# Patient Record
Sex: Female | Born: 1990 | Race: Asian | Hispanic: No | Marital: Married | Smoking: Never smoker
Health system: Southern US, Community
[De-identification: ages and names within clinical notes are randomized; demographics above are authoritative.]

---
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
## 2017-09-09 ENCOUNTER — Emergency Department (HOSPITAL_BASED_OUTPATIENT_CLINIC_OR_DEPARTMENT_OTHER): Payer: BLUE CROSS/BLUE SHIELD

## 2017-09-09 ENCOUNTER — Emergency Department (HOSPITAL_BASED_OUTPATIENT_CLINIC_OR_DEPARTMENT_OTHER)
Admission: EM | Admit: 2017-09-09 | Discharge: 2017-09-09 | Disposition: A | Discharge status: Home / Self Care | Payer: BLUE CROSS/BLUE SHIELD | Attending: Emergency Medicine | Admitting: Emergency Medicine

## 2017-09-09 ENCOUNTER — Encounter (HOSPITAL_BASED_OUTPATIENT_CLINIC_OR_DEPARTMENT_OTHER): Payer: Self-pay

## 2017-09-09 DIAGNOSIS — R1030 Lower abdominal pain, unspecified: Secondary | ICD-10-CM | POA: Diagnosis present

## 2017-09-09 DIAGNOSIS — N760 Acute vaginitis: Secondary | ICD-10-CM | POA: Diagnosis not present

## 2017-09-09 DIAGNOSIS — B9689 Other specified bacterial agents as the cause of diseases classified elsewhere: Secondary | ICD-10-CM

## 2017-09-09 DIAGNOSIS — R110 Nausea: Secondary | ICD-10-CM | POA: Insufficient documentation

## 2017-09-09 DIAGNOSIS — N83201 Unspecified ovarian cyst, right side: Secondary | ICD-10-CM | POA: Insufficient documentation

## 2017-09-09 LAB — CBC WITH DIFFERENTIAL/PLATELET
BASOS ABS: 0 10*3/uL (ref 0.0–0.1)
Basophils Relative: 0 %
EOS PCT: 0 %
Eosinophils Absolute: 0 10*3/uL (ref 0.0–0.7)
HEMATOCRIT: 38.7 % (ref 36.0–46.0)
HEMOGLOBIN: 13.9 g/dL (ref 12.0–15.0)
LYMPHS ABS: 1.5 10*3/uL (ref 0.7–4.0)
LYMPHS PCT: 17 %
MCH: 31 pg (ref 26.0–34.0)
MCHC: 35.9 g/dL (ref 30.0–36.0)
MCV: 86.2 fL (ref 78.0–100.0)
Monocytes Absolute: 0.4 10*3/uL (ref 0.1–1.0)
Monocytes Relative: 4 %
NEUTROS PCT: 79 %
Neutro Abs: 7.1 10*3/uL (ref 1.7–7.7)
Platelets: 277 10*3/uL (ref 150–400)
RBC: 4.49 MIL/uL (ref 3.87–5.11)
RDW: 12 % (ref 11.5–15.5)
WBC: 9 10*3/uL (ref 4.0–10.5)

## 2017-09-09 LAB — URINALYSIS, ROUTINE W REFLEX MICROSCOPIC
BILIRUBIN URINE: NEGATIVE
GLUCOSE, UA: NEGATIVE mg/dL
Ketones, ur: 15 mg/dL — AB
Leukocytes, UA: NEGATIVE
Nitrite: NEGATIVE
PROTEIN: NEGATIVE mg/dL
Specific Gravity, Urine: <1.005 — ABNORMAL LOW (ref 1.005–1.030)
pH: 5.5 (ref 5.0–8.0)

## 2017-09-09 LAB — COMPREHENSIVE METABOLIC PANEL
ALT: 11 U/L — AB (ref 14–54)
AST: 21 U/L (ref 15–41)
Albumin: 4.1 g/dL (ref 3.5–5.0)
Alkaline Phosphatase: 50 U/L (ref 38–126)
Anion gap: 8 (ref 5–15)
BILIRUBIN TOTAL: 1.1 mg/dL (ref 0.3–1.2)
BUN: 13 mg/dL (ref 6–20)
CO2: 22 mmol/L (ref 22–32)
CREATININE: 0.47 mg/dL (ref 0.44–1.00)
Calcium: 8.7 mg/dL — ABNORMAL LOW (ref 8.9–10.3)
Chloride: 107 mmol/L (ref 101–111)
Glucose, Bld: 95 mg/dL (ref 65–99)
Potassium: 3.6 mmol/L (ref 3.5–5.1)
Sodium: 137 mmol/L (ref 135–145)
Total Protein: 7.6 g/dL (ref 6.5–8.1)

## 2017-09-09 LAB — PREGNANCY, URINE: PREG TEST UR: NEGATIVE

## 2017-09-09 LAB — URINALYSIS, MICROSCOPIC (REFLEX)

## 2017-09-09 LAB — WET PREP, GENITAL
Sperm: NONE SEEN
Trich, Wet Prep: NONE SEEN
Yeast Wet Prep HPF POC: NONE SEEN

## 2017-09-09 LAB — LIPASE, BLOOD: LIPASE: 30 U/L (ref 11–51)

## 2017-09-09 MED ORDER — IOPAMIDOL (ISOVUE-300) INJECTION 61%
30.0000 mL | Freq: Once | INTRAVENOUS | Status: AC | PRN
  Administered 2017-09-09: 15 mL via ORAL

## 2017-09-09 MED ORDER — NAPROXEN 500 MG PO TABS: 500.0000 mg | ORAL_TABLET | Freq: Two times a day (BID) | ORAL | 0 refills | Status: DC

## 2017-09-09 MED ORDER — IOPAMIDOL (ISOVUE-300) INJECTION 61%
100.0000 mL | Freq: Once | INTRAVENOUS | Status: AC | PRN
  Administered 2017-09-09: 100 mL via INTRAVENOUS

## 2017-09-09 MED ORDER — MORPHINE SULFATE (PF) 4 MG/ML IV SOLN
4.0000 mg | Freq: Once | INTRAVENOUS | Status: AC
  Administered 2017-09-09: 4 mg via INTRAVENOUS
  Filled 2017-09-09: qty 1

## 2017-09-09 MED ORDER — ONDANSETRON HCL 4 MG/2ML IJ SOLN
4.0000 mg | Freq: Once | INTRAMUSCULAR | Status: AC
  Administered 2017-09-09: 4 mg via INTRAVENOUS
  Filled 2017-09-09: qty 2

## 2017-09-09 MED ORDER — ONDANSETRON HCL 4 MG PO TABS: 4.0000 mg | ORAL_TABLET | Freq: Three times a day (TID) | ORAL | 0 refills | Status: DC | PRN

## 2017-09-09 MED ORDER — METRONIDAZOLE 500 MG PO TABS: 500.0000 mg | ORAL_TABLET | Freq: Two times a day (BID) | ORAL | 0 refills | Status: DC

## 2017-09-09 MED ORDER — SODIUM CHLORIDE 0.9 % IV BOLUS
1000.0000 mL | Freq: Once | INTRAVENOUS | Status: AC
  Administered 2017-09-09: 1000 mL via INTRAVENOUS

## 2017-09-09 MED FILL — metroNIDAZOLE 500 MG TABS: 500 | 7 days supply | Qty: 14 | Fill #0

## 2017-09-09 MED FILL — NAPROXEN 500 MG TABLET: 500 | 15 days supply | Qty: 30 | Fill #0

## 2017-09-09 MED FILL — ONDANSETRON HCL 4 MG TABLET: 4 | 2 days supply | Qty: 4 | Fill #0

## 2017-09-09 NOTE — ED Notes (Signed)
NAD at this time. Pt is stable and going home.  

## 2017-09-09 NOTE — ED Notes (Signed)
Pt states sent here for [place] checking for ovarian cyst or CT for appendicitis

## 2017-09-09 NOTE — ED Provider Notes (Signed)
[facility] EMERGENCY DEPARTMENT Provider Note   CSN: [id number] Arrival date & time: 09/09/17  4098     History   Chief Complaint Chief Complaint  Patient presents with  . Abdominal Pain    HPI Rebecca Hughes is a 27 y.o. female no significant past medical history presents emergency department today for lower abdominal pain that began today.  Patient states that around 7:30 AM this morning she started having pain in her suprapubic and right lower quadrant that she describes as a sharp, intense pain.  She notes that the pain would occasionally radiate to her "pelvic bone".  She notes that leaning forward on the counter made her symptoms better.  She was seen at urgent care and given a dose of Toradol has improved the pain to a 2/10.  She has developed some nausea but denies any emesis.  She notes that nothing makes the symptoms worse.  She denies previous pain similar to this.  No fever, chills, chest pain, shortness of breath, emesis, diarrhea, urinary frequency, urinary urgency, hematuria, dysuria, vaginal discharge or vaginal bleeding.  Patient is sexually active with one female partner does not always use protection.  She denies history of STDs or concerns for STDs at this time.  Patient's last bowel movement was yesterday and normal.  No melena or hematochezia.  Patient denies any previous abdominal surgeries.  Patient last oral intake was at 7 AM this morning and included a glass of water.  HPI  History reviewed. No pertinent past medical history.  There are no active problems to display for this patient.   History reviewed. No pertinent surgical history.   OB History   None      Home Medications    Prior to Admission medications   Not on File    Family History No family history on file.  Social History Social History   Tobacco Use  . Smoking status: Never Smoker  . Smokeless tobacco: Never Used  Substance Use Topics  . Alcohol use: Never    Frequency:  Never  . Drug use: Never     Allergies   Patient has no known allergies.   Review of Systems Review of Systems  All other systems reviewed and are negative.    Physical Exam Updated Vital Signs BP 122/75 (BP Location: Right Arm)   Pulse 67   Temp 98.4 F (36.9 C) (Oral)   Resp 18   SpO2 100%   Physical Exam  Constitutional: She appears well-developed and well-nourished.  HENT:  Head: Normocephalic and atraumatic.  Right Ear: External ear normal.  Left Ear: External ear normal.  Nose: Nose normal.  Mouth/Throat: Uvula is midline, oropharynx is clear and moist and mucous membranes are normal. No tonsillar exudate.  Eyes: Pupils are equal, round, and reactive to light. Right eye exhibits no discharge. Left eye exhibits no discharge. No scleral icterus.  Neck: Trachea normal. Neck supple. No spinous process tenderness present. No neck rigidity. Normal range of motion present.  Cardiovascular: Normal rate, regular rhythm and intact distal pulses.  No murmur heard. Pulses:      Radial pulses are 2+ on the right side, and 2+ on the left side.       Dorsalis pedis pulses are 2+ on the right side, and 2+ on the left side.       Posterior tibial pulses are 2+ on the right side, and 2+ on the left side.  No lower extremity swelling or edema. Calves symmetric in  size bilaterally.  Pulmonary/Chest: Effort normal and breath sounds normal. She exhibits no tenderness.  Abdominal: Soft. Bowel sounds are normal. She exhibits no distension. There is tenderness in the right lower quadrant and suprapubic area. There is tenderness at McBurney's point. There is no rigidity, no rebound, no guarding, no CVA tenderness and negative Murphy's sign.  Negative Rovsing's, Psoas and Obturator signs.  Genitourinary:  Genitourinary Comments: Exam performed by Jacinto Halim, exam chaperoned Pelvic exam: normal external genitalia without evidence of trauma. VULVA: normal appearing vulva with no  masses, tenderness or lesion. VAGINA: normal appearing vagina with normal color and discharge, no lesions. CERVIX: mildly friable cervix without lesions, cervical motion tenderness absent, cervical os closed with out purulent discharge; IUD string visualized; vaginal discharge - clear, Wet prep and DNA probe for chlamydia and GC obtained.   ADNEXA: normal adnexa in size, nontender on left, tenderness on right. No masses UTERUS: uterus is normal size, shape, consistency and nontender.   Musculoskeletal: She exhibits no edema.  Lymphadenopathy:    She has no cervical adenopathy.  Neurological: She is alert.  Skin: Skin is warm and dry. No rash noted. She is not diaphoretic.  Psychiatric: She has a normal mood and affect.  Nursing note and vitals reviewed.    ED Treatments / Results  Labs (all labs ordered are listed, but only abnormal results are displayed) Labs Reviewed  WET PREP, GENITAL - Abnormal; Notable for the following components:      Result Value   Clue Cells Wet Prep HPF POC PRESENT (*)    WBC, Wet Prep HPF POC MANY (*)    All other components within normal limits  COMPREHENSIVE METABOLIC PANEL - Abnormal; Notable for the following components:   Calcium 8.7 (*)    ALT 11 (*)    All other components within normal limits  URINALYSIS, ROUTINE W REFLEX MICROSCOPIC - Abnormal; Notable for the following components:   Color, Urine STRAW (*)    Specific Gravity, Urine <1.005 (*)    Hgb urine dipstick TRACE (*)    Ketones, ur 15 (*)    All other components within normal limits  URINALYSIS, MICROSCOPIC (REFLEX) - Abnormal; Notable for the following components:   Bacteria, UA RARE (*)    All other components within normal limits  CBC WITH DIFFERENTIAL/PLATELET  LIPASE, BLOOD  PREGNANCY, URINE  GC/CHLAMYDIA PROBE AMP () NOT AT [facility]    EKG None  Radiology US Transvaginal Non-ob  Result Date: 09/09/2017 CLINICAL DATA:  Right adnexal pain. EXAM: TRANSABDOMINAL AND  TRANSVAGINAL ULTRASOUND OF PELVIS DOPPLER ULTRASOUND OF OVARIES TECHNIQUE: Both transabdominal and transvaginal ultrasound examinations of the pelvis were performed. Transabdominal technique was performed for global imaging of the pelvis including uterus, ovaries, adnexal regions, and pelvic cul-de-sac. It was necessary to proceed with endovaginal exam following the transabdominal exam to visualize the ovaries and adnexal regions. Color and duplex Doppler ultrasound was utilized to evaluate blood flow to the ovaries. COMPARISON:  None. FINDINGS: Uterus Measurements: Measures 9.1 x 4.6 x 6.3 cm. No fibroids or other mass visualized. Endometrium Thickness: 4 mm, within normal limits. Intrauterine contraceptive device is in place. Right ovary Measurements: 4.5 x 2.0 x 3.2 cm. 1.6 cm slightly hyperechoic structure in the right ovary is likely a hemorrhagic follicle or cyst. Color Doppler flow is identified. Left ovary Measurements: 3.7 x 1.8 x 2.5 cm. There may be a corpus luteum cyst in the left ovary. Color Doppler flow is identified. Pulsed Doppler evaluation of both  ovaries demonstrates normal low-resistance arterial and venous waveforms. Other findings No free fluid. IMPRESSION: 1. No evidence of ovarian torsion. 2. Probable hemorrhagic follicle or cyst in the right ovary. Electronically Signed   By: Leanna Battles M.D.   On: 09/09/2017 12:49   US Pelvis Complete  Result Date: 09/09/2017 CLINICAL DATA:  Right adnexal pain. EXAM: TRANSABDOMINAL AND TRANSVAGINAL ULTRASOUND OF PELVIS DOPPLER ULTRASOUND OF OVARIES TECHNIQUE: Both transabdominal and transvaginal ultrasound examinations of the pelvis were performed. Transabdominal technique was performed for global imaging of the pelvis including uterus, ovaries, adnexal regions, and pelvic cul-de-sac. It was necessary to proceed with endovaginal exam following the transabdominal exam to visualize the ovaries and adnexal regions. Color and duplex Doppler ultrasound  was utilized to evaluate blood flow to the ovaries. COMPARISON:  None. FINDINGS: Uterus Measurements: Measures 9.1 x 4.6 x 6.3 cm. No fibroids or other mass visualized. Endometrium Thickness: 4 mm, within normal limits. Intrauterine contraceptive device is in place. Right ovary Measurements: 4.5 x 2.0 x 3.2 cm. 1.6 cm slightly hyperechoic structure in the right ovary is likely a hemorrhagic follicle or cyst. Color Doppler flow is identified. Left ovary Measurements: 3.7 x 1.8 x 2.5 cm. There may be a corpus luteum cyst in the left ovary. Color Doppler flow is identified. Pulsed Doppler evaluation of both ovaries demonstrates normal low-resistance arterial and venous waveforms. Other findings No free fluid. IMPRESSION: 1. No evidence of ovarian torsion. 2. Probable hemorrhagic follicle or cyst in the right ovary. Electronically Signed   By: Leanna Battles M.D.   On: 09/09/2017 12:49   Ct Abdomen Pelvis W Contrast  Result Date: 09/09/2017 CLINICAL DATA:  Right lower quadrant abdominal pain starting this morning EXAM: CT ABDOMEN AND PELVIS WITH CONTRAST TECHNIQUE: Multidetector CT imaging of the abdomen and pelvis was performed using the standard protocol following bolus administration of intravenous contrast. CONTRAST:  ISOVUE-300 IOPAMIDOL (ISOVUE-300) INJECTION 61% COMPARISON:  None. FINDINGS: Lower chest:  No contributory findings. Hepatobiliary: No focal liver abnormality.No evidence of biliary obstruction or stone. Pancreas: Pancreas wraps around the ventral and lateral aspect of the proximal duodenum, but no complete annular pancreas based on axial slices. Spleen: Unremarkable. Adrenals/Urinary Tract: Negative adrenals. No hydronephrosis or stone. Unremarkable bladder. Stomach/Bowel: No obstruction. No appendicitis. Formed stool throughout much of the colon. Vascular/Lymphatic: No acute vascular abnormality. No mass or adenopathy. Reproductive:IUD in expected position. Negative ovaries with corpus  luteum on the left. Other: No ascites or pneumoperitoneum. Musculoskeletal: No acute abnormalities. IMPRESSION: 1. No acute finding.  No appendicitis. 2. Formed stool throughout the colon. Electronically Signed   By: Marnee Spring M.D.   On: 09/09/2017 12:59   [place] Art/ven Flow Abd Pelv Doppler  Result Date: 09/09/2017 CLINICAL DATA:  Right adnexal pain. EXAM: TRANSABDOMINAL AND TRANSVAGINAL ULTRASOUND OF PELVIS DOPPLER ULTRASOUND OF OVARIES TECHNIQUE: Both transabdominal and transvaginal ultrasound examinations of the pelvis were performed. Transabdominal technique was performed for global imaging of the pelvis including uterus, ovaries, adnexal regions, and pelvic cul-de-sac. It was necessary to proceed with endovaginal exam following the transabdominal exam to visualize the ovaries and adnexal regions. Color and duplex Doppler ultrasound was utilized to evaluate blood flow to the ovaries. COMPARISON:  None. FINDINGS: Uterus Measurements: Measures 9.1 x 4.6 x 6.3 cm. No fibroids or other mass visualized. Endometrium Thickness: 4 mm, within normal limits. Intrauterine contraceptive device is in place. Right ovary Measurements: 4.5 x 2.0 x 3.2 cm. 1.6 cm slightly hyperechoic structure in the right ovary is likely a hemorrhagic  follicle or cyst. Color Doppler flow is identified. Left ovary Measurements: 3.7 x 1.8 x 2.5 cm. There may be a corpus luteum cyst in the left ovary. Color Doppler flow is identified. Pulsed Doppler evaluation of both ovaries demonstrates normal low-resistance arterial and venous waveforms. Other findings No free fluid. IMPRESSION: 1. No evidence of ovarian torsion. 2. Probable hemorrhagic follicle or cyst in the right ovary. Electronically Signed   By: Leanna Battles M.D.   On: 09/09/2017 12:49    Procedures Procedures (including critical care time)  Medications Ordered in ED Medications  sodium chloride 0.9 % bolus 1,000 mL (has no administration in time range)  morphine 4 MG/ML  injection 4 mg (has no administration in time range)  ondansetron (ZOFRAN) injection 4 mg (has no administration in time range)     Initial Impression / Assessment and Plan / ED Course  I have reviewed the triage vital signs and the nursing notes.  Pertinent labs & imaging results that were available during my care of the patient were reviewed by me and considered in my medical decision making (see chart for details).     27 year old female with no previous abdominal disease or significant past medical history presenting for right upper quadrant and suprapubic abdominal pain that began this morning.  Patient was seen at urgent care he received Toradol injection prior to arrival.  Patient was sent over to rule out ovarian cyst versus appendicitis.  Patient denies any fever, chills, vomiting/diarrhea.  Patient does have suprapubic and right lower quadrant abdominal tenderness palpation without any peritoneal signs.  Pelvic exam with friable cervix as well as right adnexal tenderness palpation.  No masses felt.  No cervical motion tenderness to make me concern for PID.  Patient's labs overall reassuring.  Patient without leukocytosis, anemia, significant electrolyte derangements, acute kidney injury, or anion gap acidosis.  Lipase within normal limits.  Ultrasound without evidence of ovarian torsion.  There is a probable hemorrhagic follicle or cyst of the right ovary.  Likely the cause of the patient's pain.  Patient given IV fluids as well as pain medication in the department with relief of her symptoms.  Patient CT scan without any intra-abdominal processes.  Repeat abdominal exam without tenderness or peritoneal signs.  Will have the patient follow-up with OB.  She also has BV.  Will treat with Flagyl.  Will advise patient to avoid alcohol while on this medication.  Specific return precautions discussed. Time was given for all questions to be answered. The patient verbalized understanding and agreement  with plan. The patient appears safe for discharge home.  Final Clinical Impressions(s) / ED Diagnoses   Final diagnoses:  Cyst of right ovary  Bacterial vaginosis    ED Discharge Orders        Ordered    naproxen (NAPROSYN) 500 MG tablet  2 times daily     09/09/17 1404    ondansetron (ZOFRAN) 4 MG tablet  Every 8 hours PRN     09/09/17 1404    metroNIDAZOLE (FLAGYL) 500 MG tablet  2 times daily     09/09/17 1404       Princella Pellegrini 09/09/17 1407    Jacalyn Lefevre, MD 09/09/17 1447

## 2017-09-09 NOTE — ED Triage Notes (Signed)
Pt sent here from [facility] for further evaluation. Pt c/o lower abdominal pain that started at 0740, was giving toradol  IM with relief; denies n/v/d

## 2017-09-09 NOTE — Discharge Instructions (Signed)
Your workup was reassuring. Your received blood work, CT scan, and an Ultrasound today. The ultrasound showed a possible hemorrhagic follicle or cyst in the right ovary. I would like you to follow up with OBGYN for this (either yours or the one provided). You were also found to have bacterial vaginosis. Please see attached handout.  Do not drink alcohol while taking metronidazole (Flagyl). If you develop worsening or new concerning symptoms you can return to the emergency department for re-evaluation.

## 2017-09-10 LAB — GC/CHLAMYDIA PROBE AMP (~~LOC~~) NOT AT ARMC
Chlamydia: NEGATIVE
NEISSERIA GONORRHEA: NEGATIVE

## 2017-12-15 ENCOUNTER — Other Ambulatory Visit: Payer: Self-pay | Admitting: Dentistry

## 2017-12-15 DIAGNOSIS — M26629 Arthralgia of temporomandibular joint, unspecified side: Principal | ICD-10-CM

## 2017-12-15 DIAGNOSIS — G8929 Other chronic pain: Secondary | ICD-10-CM

## 2017-12-17 ENCOUNTER — Ambulatory Visit
Admission: RE | Admit: 2017-12-17 | Discharge: 2017-12-17 | Disposition: A | Discharge status: Home / Self Care | Payer: BLUE CROSS/BLUE SHIELD | Source: Ambulatory Visit | Attending: Dentistry | Admitting: Dentistry

## 2017-12-17 DIAGNOSIS — G8929 Other chronic pain: Secondary | ICD-10-CM

## 2017-12-17 DIAGNOSIS — M26629 Arthralgia of temporomandibular joint, unspecified side: Principal | ICD-10-CM

## 2017-12-25 ENCOUNTER — Ambulatory Visit
Admission: RE | Admit: 2017-12-25 | Discharge: 2017-12-25 | Disposition: A | Discharge status: Home / Self Care | Payer: BLUE CROSS/BLUE SHIELD | Source: Ambulatory Visit | Attending: Chiropractic Medicine | Admitting: Chiropractic Medicine

## 2017-12-25 ENCOUNTER — Other Ambulatory Visit: Payer: Self-pay | Admitting: Chiropractic Medicine

## 2017-12-25 DIAGNOSIS — R52 Pain, unspecified: Secondary | ICD-10-CM

## 2019-02-03 IMAGING — US US TRANSVAGINAL NON-OB
1 series · 13 of 25 positions shown · non-contrast
Comparison: None.

CLINICAL DATA: Right adnexal pain.

EXAM:
TRANSABDOMINAL AND TRANSVAGINAL ULTRASOUND OF PELVIS
DOPPLER ULTRASOUND OF OVARIES
TECHNIQUE: Both transabdominal and transvaginal ultrasound examinations of the
pelvis were performed. Transabdominal technique was performed for
global imaging of the pelvis including uterus, ovaries, adnexal
regions, and pelvic cul-de-sac.
It was necessary to proceed with endovaginal exam following the
transabdominal exam to visualize the ovaries and adnexal regions..
Color and duplex Doppler ultrasound was utilized to evaluate blood
flow to the ovaries.

[Series 1: us transvaginal non-ob · 0.22mm/px · 13 of 103 slices shown]
[im 1/103]
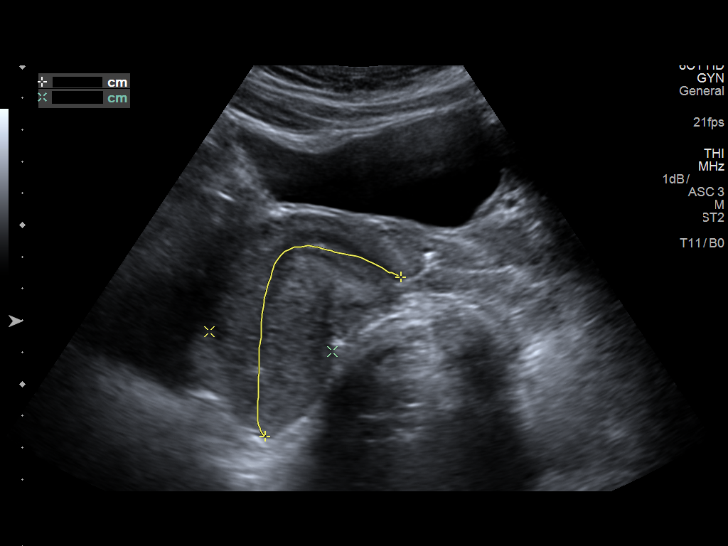
[im 9/103]
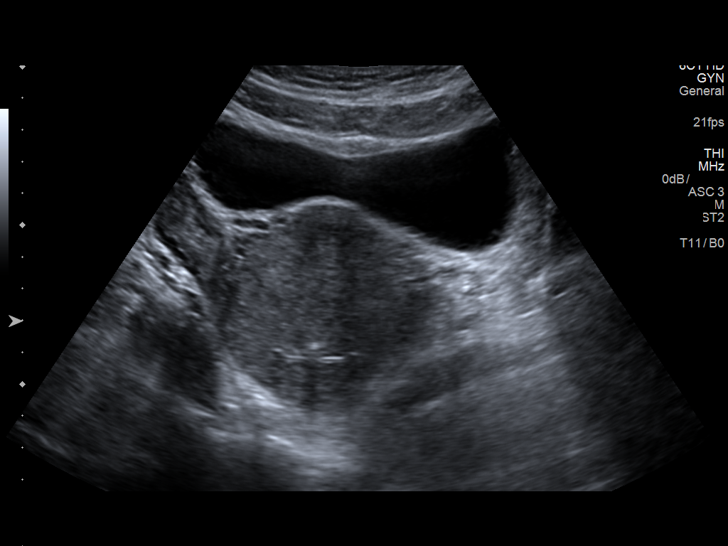
[im 18/103]
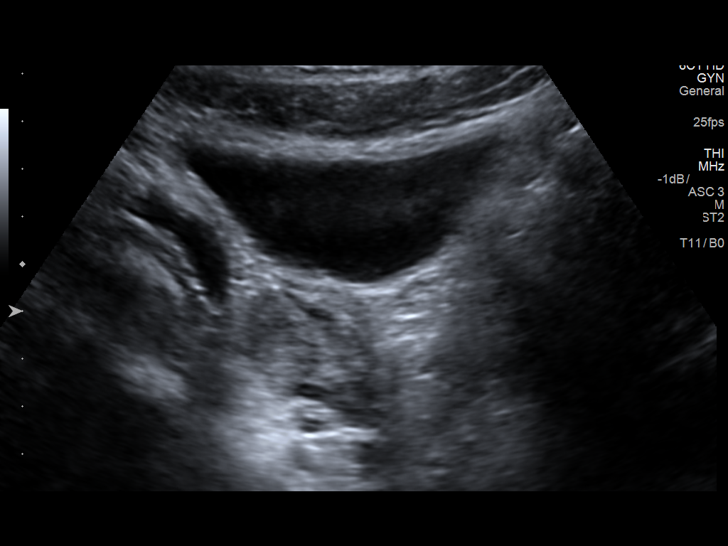
[im 26/103]
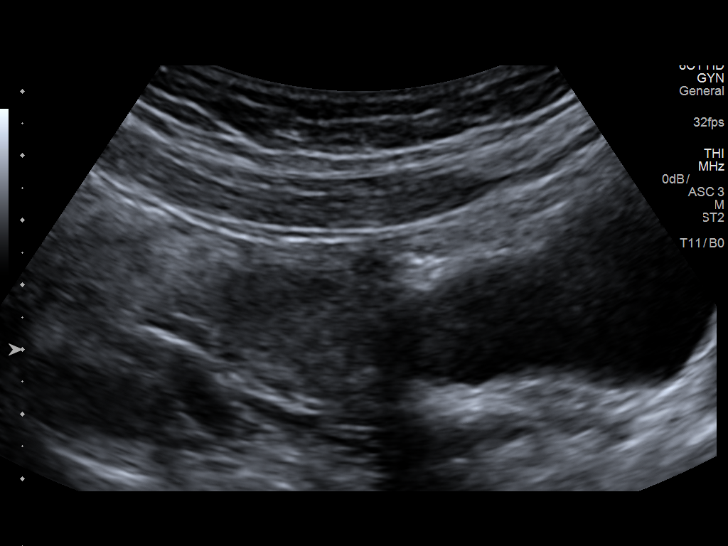
[im 35/103]
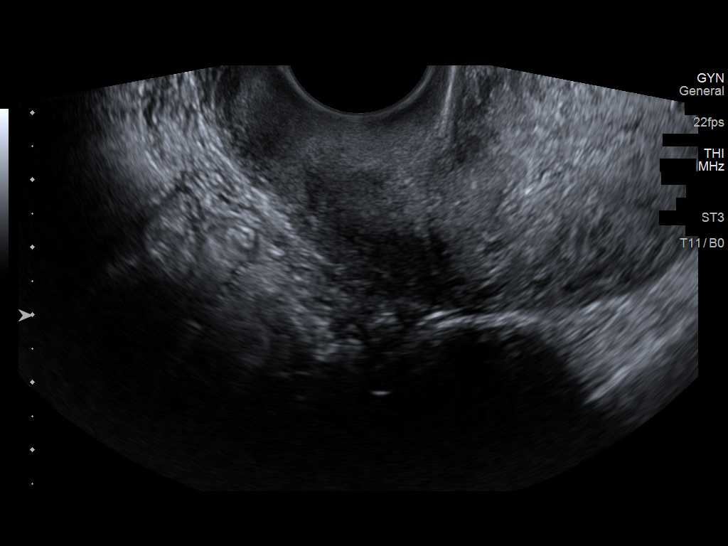
[im 43/103]
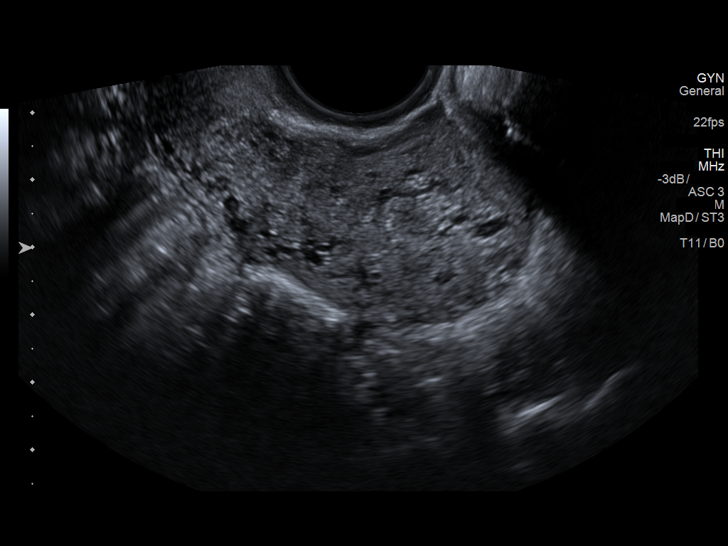
[im 52/103]
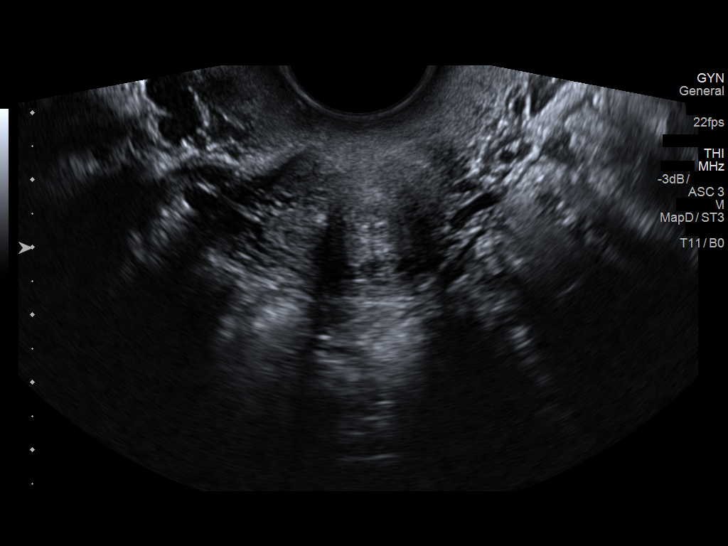
[im 60/103]
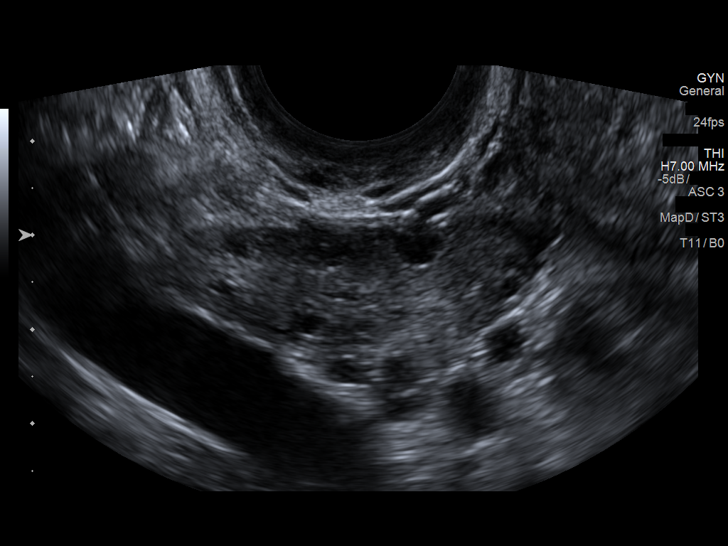
[im 69/103]
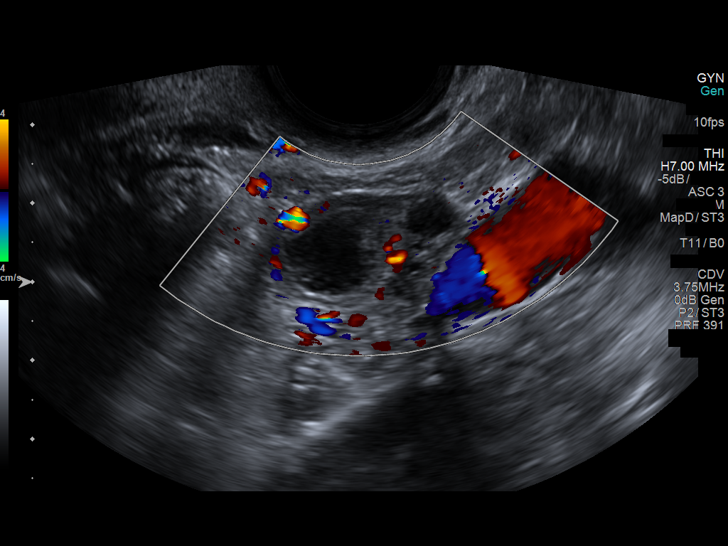
[im 77/103]
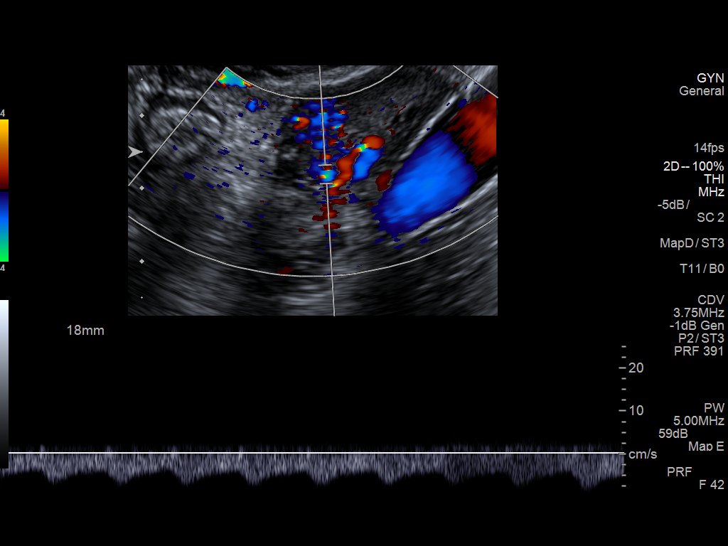
[im 86/103]
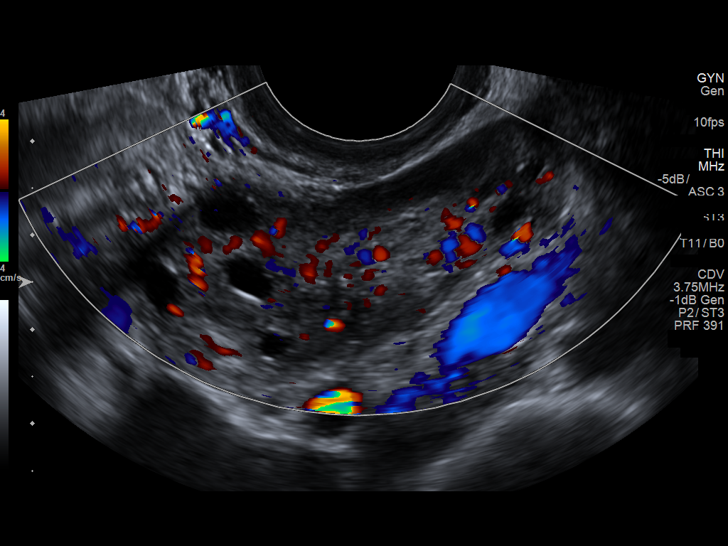
[im 94/103]
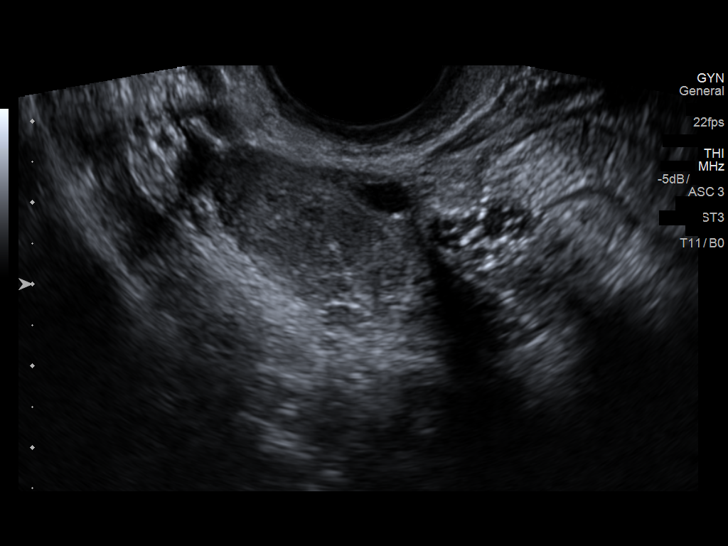
[im 103/103]
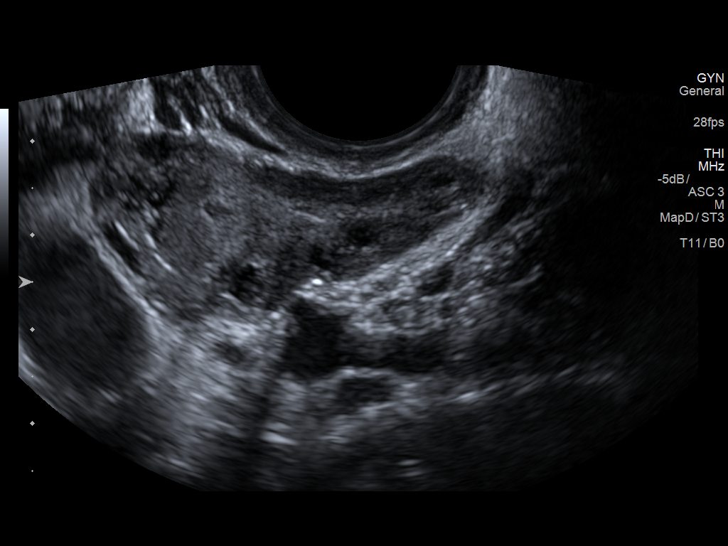

[13 of 25 positions shown; findings below may reference images not displayed]

FINDINGS: Uterus

Measurements: Measures 9.1 x 4.6 x 6.3 cm. No fibroids or other mass
visualized.

Endometrium

Thickness: 4 mm, within normal limits.. Intrauterine contraceptive
device is in place.

Right ovary

Measurements: 4.5 x 2.0 x 3.2 cm. 1.6 cm slightly hyperechoic
structure in the right ovary is likely a hemorrhagic follicle or
cyst. Color Doppler flow is identified.

Left ovary

Measurements: 3.7 x 1.8 x 2.5 cm. There may be a corpus luteum cyst
in the left ovary. Color Doppler flow is identified.

Pulsed Doppler evaluation of both ovaries demonstrates normal
low-resistance arterial and venous waveforms.

Other findings

No free fluid.
IMPRESSION: 1. No evidence of ovarian torsion.
2. Probable hemorrhagic follicle or cyst in the right ovary.

## 2019-04-16 IMAGING — CT CT ABD-PELV W/ CM
2 of 4 series · 16 of 46 positions shown, 18 images · IV contrast (APPLIED)
Comparison: None.

CLINICAL DATA: Right lower quadrant abdominal pain starting this
morning

EXAM:
CT ABDOMEN AND PELVIS WITH CONTRAST
TECHNIQUE: Multidetector CT imaging of the abdomen and pelvis was performed
using the standard protocol following bolus administration of
intravenous contrast.
CONTRAST:  100mL IO899Y-CPP IOPAMIDOL (IO899Y-CPP) INJECTION 61%

[Series 2: axial st · axial · 0.78mm/px · z∈[-438,-48]mm · 13 of 86 slices shown, 15 images]
[im 4/86  soft-tissue]
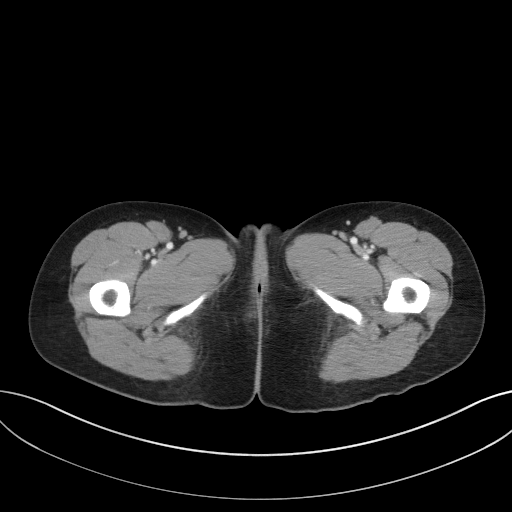
[im 4/86  bone]
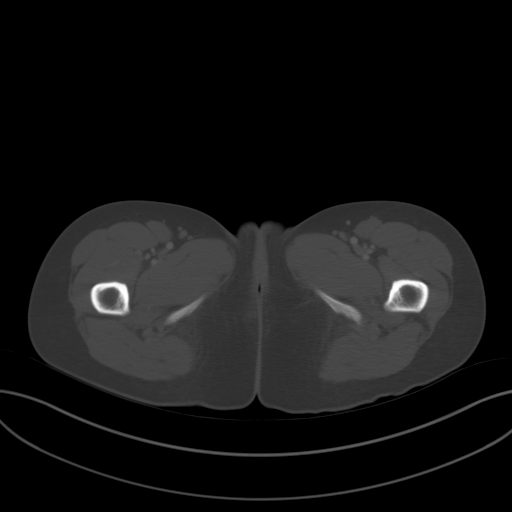
[im 11/86  soft-tissue]
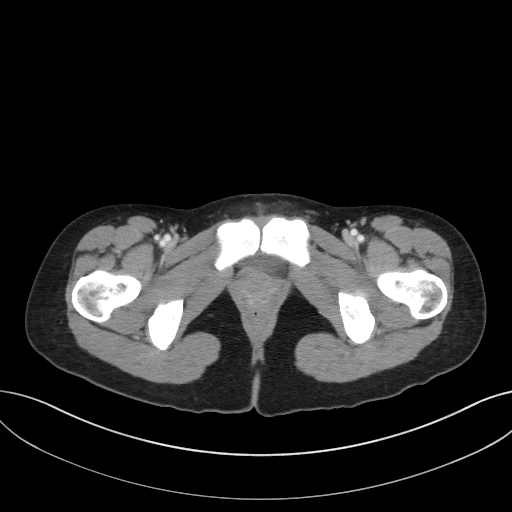
[im 18/86  soft-tissue]
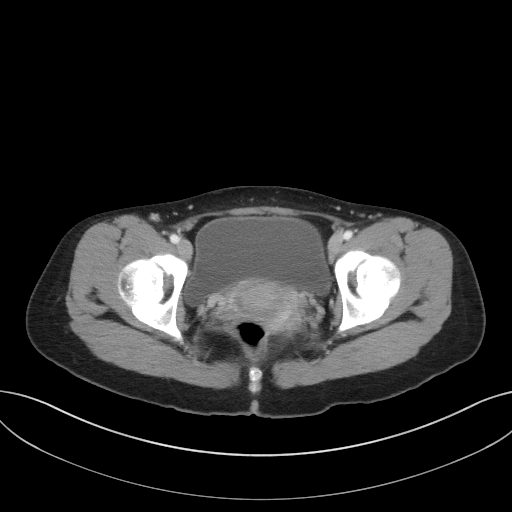
[im 24/86  soft-tissue]
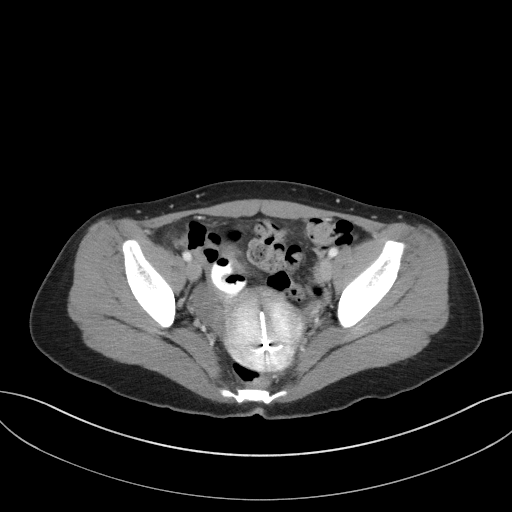
[im 31/86  soft-tissue]
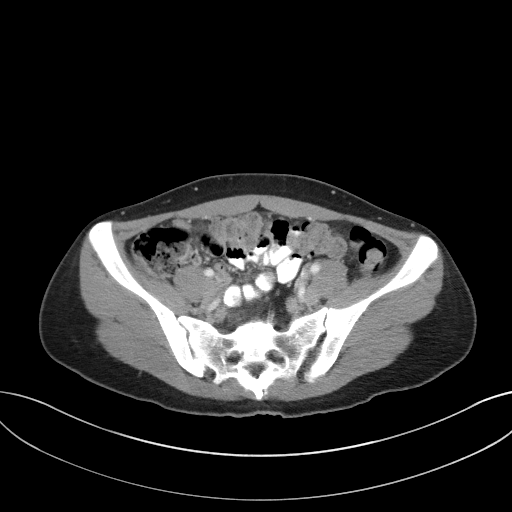
[im 38/86  soft-tissue]
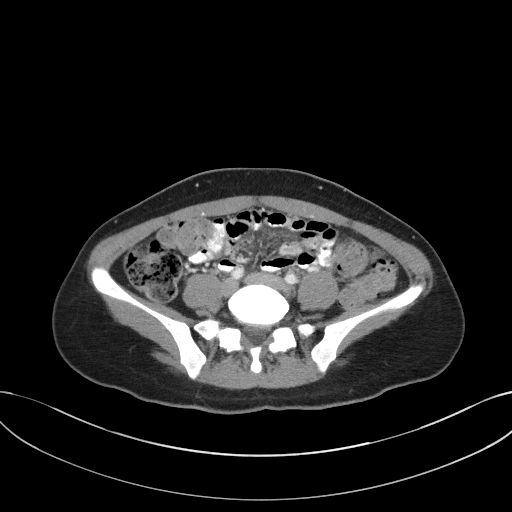
[im 45/86  soft-tissue]
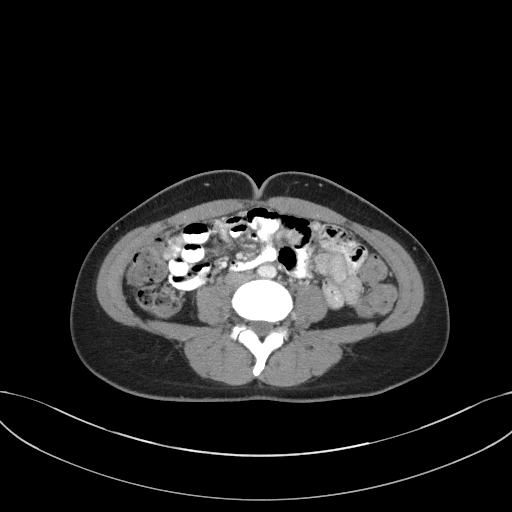
[im 48/86  soft-tissue]
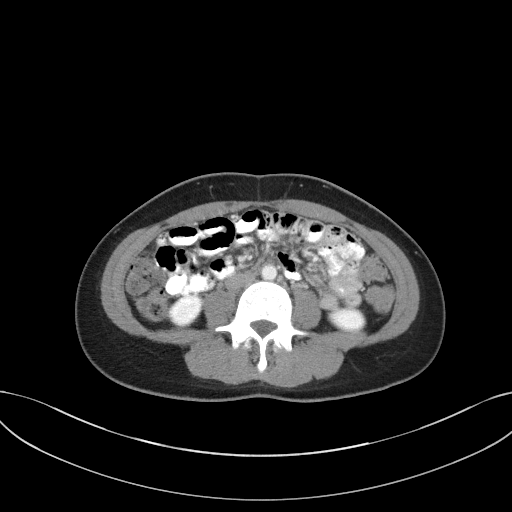
[im 55/86  soft-tissue]
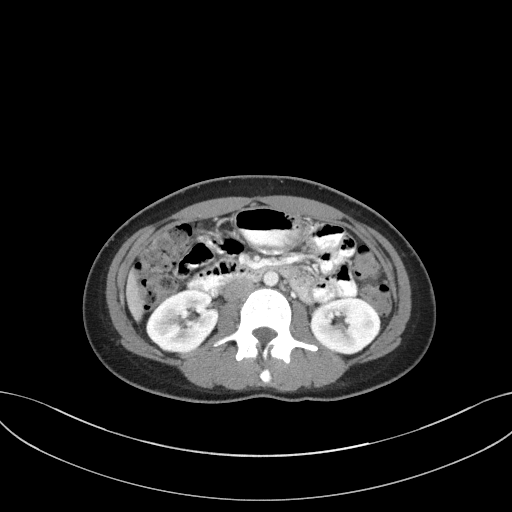
[im 55/86  bone]
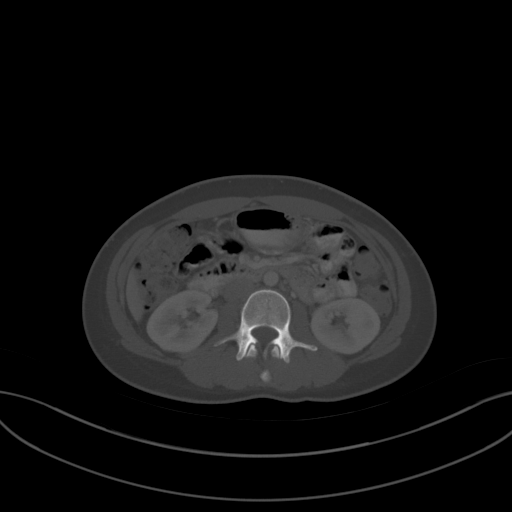
[im 62/86  soft-tissue]
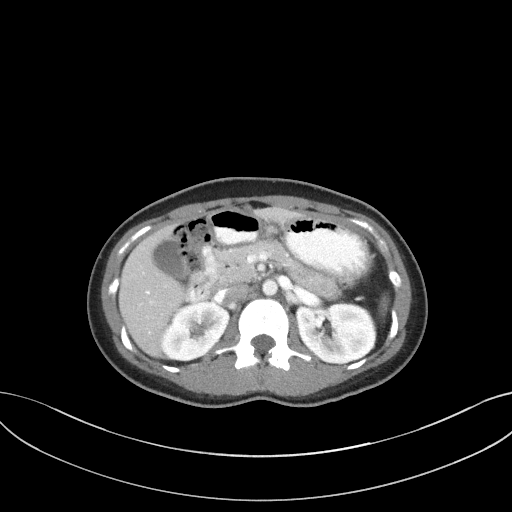
[im 69/86  soft-tissue]
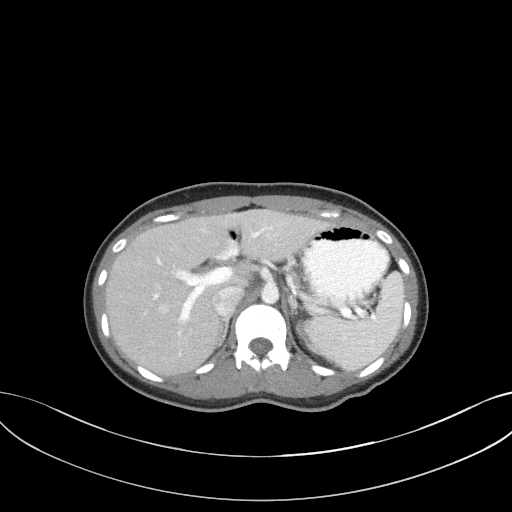
[im 75/86  soft-tissue]
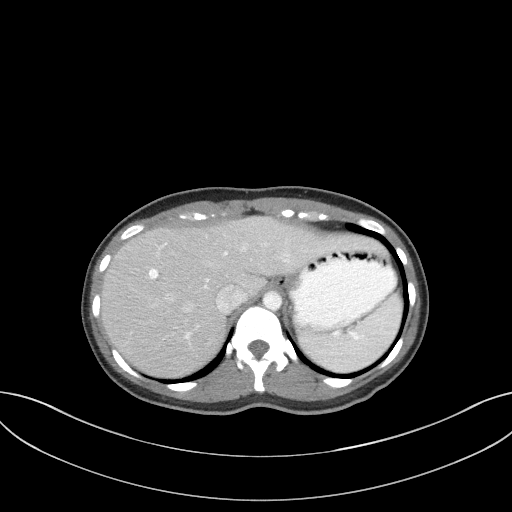
[im 82/86  soft-tissue]
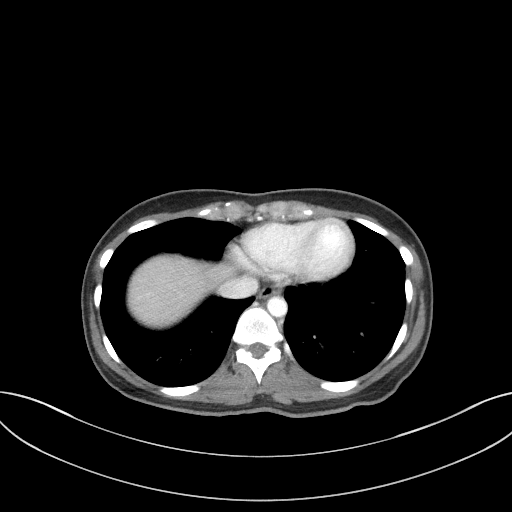

[Series 5: coronal st · coronal · 0.68mm/px · 3 of 67 slices shown]
[im 23/67  soft-tissue]
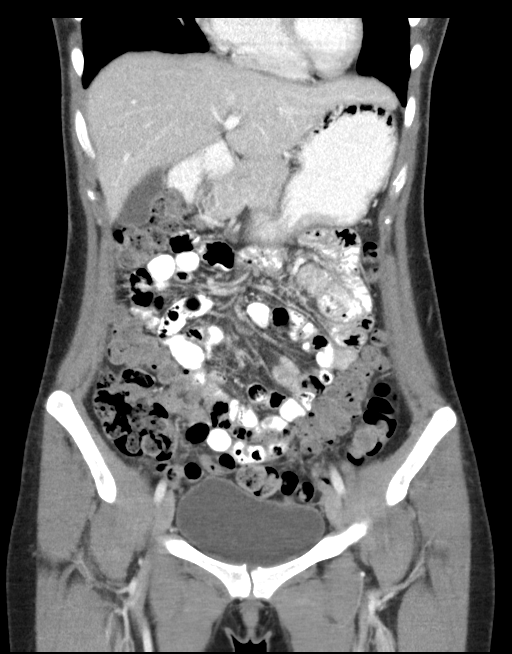
[im 30/67  soft-tissue]
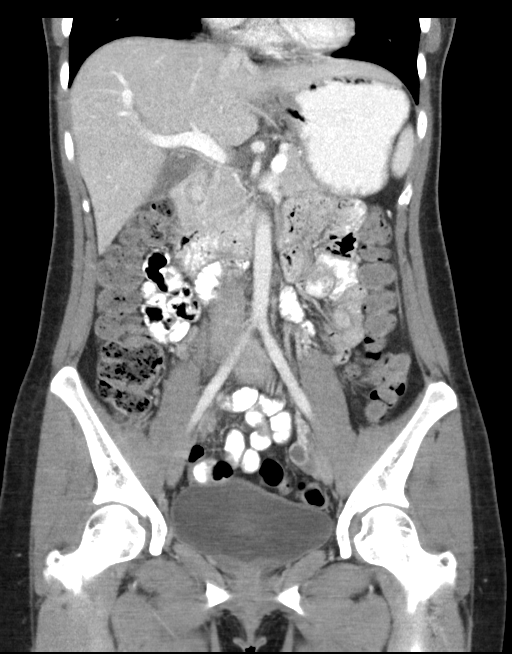
[im 37/67  soft-tissue]
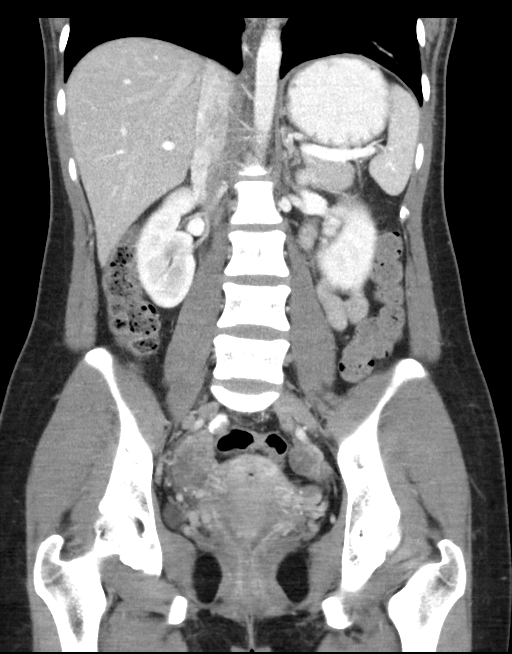

[16 of 46 positions shown; findings below may reference images not displayed]

FINDINGS: Lower chest:  No contributory findings.

Hepatobiliary: No focal liver abnormality.No evidence of biliary
obstruction or stone.

Pancreas: Pancreas wraps around the ventral and lateral aspect of
the proximal duodenum, but no complete annular pancreas based on
axial slices.

Spleen: Unremarkable.

Adrenals/Urinary Tract: Negative adrenals. No hydronephrosis or
stone. Unremarkable bladder.

Stomach/Bowel: No obstruction. No appendicitis. Formed stool
throughout much of the colon.

Vascular/Lymphatic: No acute vascular abnormality. No mass or
adenopathy.

Reproductive:IUD in expected position. Negative ovaries with corpus
luteum on the left.

Other: No ascites or pneumoperitoneum.

Musculoskeletal: No acute abnormalities.
IMPRESSION: 1. No acute finding.  No appendicitis.
2. Formed stool throughout the colon.

## 2019-09-19 DIAGNOSIS — F329 Major depressive disorder, single episode, unspecified: Secondary | ICD-10-CM | POA: Insufficient documentation

## 2020-03-01 ENCOUNTER — Encounter: Payer: Self-pay | Admitting: Allergy and Immunology

## 2020-03-01 ENCOUNTER — Ambulatory Visit (INDEPENDENT_AMBULATORY_CARE_PROVIDER_SITE_OTHER): Payer: BLUE CROSS/BLUE SHIELD | Admitting: Allergy and Immunology

## 2020-03-01 ENCOUNTER — Other Ambulatory Visit: Payer: Self-pay

## 2020-03-01 VITALS — BP 98/50 | HR 71 | Temp 97.3°F | Resp 12 | Ht 63.58 in | Wt 112.6 lb

## 2020-03-01 DIAGNOSIS — T7840XD Allergy, unspecified, subsequent encounter: Secondary | ICD-10-CM | POA: Diagnosis not present

## 2020-03-01 DIAGNOSIS — T7840XA Allergy, unspecified, initial encounter: Secondary | ICD-10-CM | POA: Insufficient documentation

## 2020-03-01 NOTE — Assessment & Plan Note (Signed)
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 Todays skin tests were equivocal. Therefore, we will investigate further with serum specific IgE levels and, if negative, open graded oral challenge.  A laboratory order form has been provided for serum specific IgE against mushroom.  Until the food allergy has been definitively ruled out, the patient is to continue meticulous avoidance and have access to epinephrine autoinjector 2 pack.

## 2020-03-01 NOTE — Progress Notes (Signed)
New Patient Note  RE: [name] MRN: [id number] DOB: 26-Aug-1990 Date of Office Visit: 03/01/2020  Referring provider: No ref. provider found Primary care provider: Patient, No Pcp Per  Chief Complaint: Allergic Reaction   History of present illness: Rebecca Hughes is a 29 y.o. female presenting today for evaluation of allergic reaction. She reports that approximately 1 month ago she was taking part in a forging event, in which participants would find mushrooms, select nonpoisonous varieties, and then eat the mushrooms.  She enjoys consuming mushrooms and has never experienced symptoms associated with the consumption mushroom, however shortly after tasting the liquid from a pepper white milk capped mushroom she experienced oropharyngeal pruritus/tingling, a "heavy chest" like someone was sitting on her chest, and the sensation of throat tightening.  She went to the local urgent care and was treated with epinephrine and IV diphenhydramine.  She was discharged with a prescription for epinephrine autoinjectors.  Since that time, she has consumed mushrooms on 1 or 2 occasions and is uncertain if she experienced actual or perceived symptoms because of anxiety associated with eating mushrooms.  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 Todays skin tests were equivocal. Therefore, we will investigate further with serum specific IgE levels and, if negative, open graded oral challenge.  A laboratory order form has been provided for serum specific IgE against mushroom.  Until the food allergy has been definitively ruled out, the patient is to continue meticulous avoidance and have access to epinephrine autoinjector 2 pack.   No orders of the defined types were placed in this encounter.   Diagnostics: Food allergen skin testing: Mushroom skin test equivocal.    Physical examination: Blood pressure (!)  98/50, pulse 71, temperature (!) 97.3 F (36.3 C), temperature source Temporal, resp. rate 12, height 5' 3.58" (1.615 m), weight 112 lb 9.6 oz (51.1 kg), SpO2 98 %.  General: Alert, interactive, in no acute distress. Neck: Supple without lymphadenopathy. Lungs: Clear to auscultation without wheezing, rhonchi or rales. CV: Normal S1, S2 without murmurs. Abdomen: Nondistended, nontender. Skin: Warm and dry, without lesions or rashes. Extremities:  No clubbing, cyanosis or edema. Neuro:   Grossly intact.  Review of systems:  Review of systems negative except as noted in HPI / PMHx or noted below: Review of Systems  Constitutional: Negative.   HENT: Negative.   Eyes: Negative.   Respiratory: Negative.   Cardiovascular: Negative.   Gastrointestinal: Negative.   Genitourinary: Negative.   Musculoskeletal: Negative.   Skin: Negative.   Neurological: Negative.   Endo/Heme/Allergies: Negative.   Psychiatric/Behavioral: Negative.     Past medical history:  History reviewed. No pertinent past medical history.  Past surgical history:  Past Surgical History:  Procedure Laterality Date  . MASS EXCISION Right 2009   Chest (Benign per patient)    Family history: Family History  Problem Relation Age of Onset  . Allergic rhinitis Sister   . Allergic rhinitis Brother   . Angioedema Neg Hx   . Asthma Neg Hx   . Atopy Neg Hx   . Eczema Neg Hx   . Urticaria Neg Hx   . Immunodeficiency Neg Hx     Social history: Social History   Socioeconomic History  . Marital status: Single    Spouse name: Not on file  . Number of children: Not on file  . Years of education: Not on file  . Highest education level: Not on file  Occupational History  .  Not on file  Tobacco Use  . Smoking status: Never Smoker  . Smokeless tobacco: Never Used  Vaping Use  . Vaping Use: Never used  Substance and Sexual Activity  . Alcohol use: Never  . Drug use: Never  . Sexual activity: Not on file    Other Topics Concern  . Not on file  Social History Narrative  . Not on file   Social Determinants of Health   Financial Resource Strain:   . Difficulty of Paying Living Expenses: Not on file  Food Insecurity:   . Worried About Programme researcher, broadcasting/film/video in the Last Year: Not on file  . Ran Out of Food in the Last Year: Not on file  Transportation Needs:   . Lack of Transportation (Medical): Not on file  . Lack of Transportation (Non-Medical): Not on file  Physical Activity:   . Days of Exercise per Week: Not on file  . Minutes of Exercise per Session: Not on file  Stress:   . Feeling of Stress : Not on file  Social Connections:   . Frequency of Communication with Friends and Family: Not on file  . Frequency of Social Gatherings with Friends and Family: Not on file  . Attends Religious Services: Not on file  . Active Member of Clubs or Organizations: Not on file  . Attends Banker Meetings: Not on file  . Marital Status: Not on file  Intimate Partner Violence:   . Fear of Current or Ex-Partner: Not on file  . Emotionally Abused: Not on file  . Physically Abused: Not on file  . Sexually Abused: Not on file    Environmental History: Patient lives in a condominium with carpeting throughout and central air/heat.  There is no known mold/water damage in the home.  There are no pets in the home.  She is a non-smoker.  Current Outpatient Medications  Medication Sig Dispense Refill  . EPINEPHrine (EPIPEN 2-PAK) 0.3 mg/0.3 mL IJ SOAJ injection Inject 0.3 mg into the muscle as needed for anaphylaxis.    Marland Kitchen metroNIDAZOLE (FLAGYL) 500 MG tablet Take 1 tablet (500 mg total) by mouth 2 (two) times daily. 14 tablet 0   No current facility-administered medications for this visit.    Known medication allergies: No Known Allergies  I appreciate the opportunity to take part in Aerianna's care. Please do not hesitate to contact me with questions.  Sincerely,   R. Jorene Guest, MD

## 2020-03-01 NOTE — Patient Instructions (Signed)
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 Todays skin tests were equivocal. Therefore, we will investigate further with serum specific IgE levels and, if negative, open graded oral challenge.  A laboratory order form has been provided for serum specific IgE against mushroom.  Until the food allergy has been definitively ruled out, the patient is to continue meticulous avoidance and have access to epinephrine autoinjector 2 pack.   When lab results have returned you will be called with further recommendations. With the newly implemented Cures Act, the labs may be visible to you at the same time they become visible to [place]. However, the results will typically not be addressed until all of the results are back, so please be patient.  Until you have heard from [place], please continue the treatment plan as outlined on your take home sheet.

## 2020-03-04 LAB — ALLERGEN, MUSHROOM, RF212: Mushroom IgE: <0.1 kU/L

## 2020-04-17 DIAGNOSIS — R87610 Atypical squamous cells of undetermined significance on cytologic smear of cervix (ASC-US): Secondary | ICD-10-CM | POA: Insufficient documentation

## 2021-03-05 ENCOUNTER — Encounter (HOSPITAL_BASED_OUTPATIENT_CLINIC_OR_DEPARTMENT_OTHER): Payer: Self-pay | Admitting: Emergency Medicine

## 2021-03-05 ENCOUNTER — Inpatient Hospital Stay (HOSPITAL_BASED_OUTPATIENT_CLINIC_OR_DEPARTMENT_OTHER)
Admission: EM | Admit: 2021-03-05 | Discharge: 2021-03-12 | DRG: 556 | Disposition: A | Discharge status: Home Health | Payer: BLUE CROSS/BLUE SHIELD | Attending: Internal Medicine | Admitting: Internal Medicine

## 2021-03-05 ENCOUNTER — Other Ambulatory Visit: Payer: Self-pay

## 2021-03-05 ENCOUNTER — Emergency Department (HOSPITAL_BASED_OUTPATIENT_CLINIC_OR_DEPARTMENT_OTHER): Payer: BLUE CROSS/BLUE SHIELD

## 2021-03-05 ENCOUNTER — Ambulatory Visit
Admission: EM | Admit: 2021-03-05 | Discharge: 2021-03-05 | Disposition: A | Discharge status: Home / Self Care | Payer: BLUE CROSS/BLUE SHIELD

## 2021-03-05 DIAGNOSIS — M791 Myalgia, unspecified site: Secondary | ICD-10-CM

## 2021-03-05 DIAGNOSIS — W57XXXA Bitten or stung by nonvenomous insect and other nonvenomous arthropods, initial encounter: Secondary | ICD-10-CM | POA: Diagnosis present

## 2021-03-05 DIAGNOSIS — M25461 Effusion, right knee: Secondary | ICD-10-CM | POA: Diagnosis present

## 2021-03-05 DIAGNOSIS — M02362 Reiter's disease, left knee: Secondary | ICD-10-CM | POA: Diagnosis present

## 2021-03-05 DIAGNOSIS — R21 Rash and other nonspecific skin eruption: Secondary | ICD-10-CM | POA: Diagnosis present

## 2021-03-05 DIAGNOSIS — M7918 Myalgia, other site: Secondary | ICD-10-CM | POA: Diagnosis not present

## 2021-03-05 DIAGNOSIS — M02361 Reiter's disease, right knee: Secondary | ICD-10-CM | POA: Diagnosis present

## 2021-03-05 DIAGNOSIS — M436 Torticollis: Secondary | ICD-10-CM | POA: Diagnosis present

## 2021-03-05 DIAGNOSIS — M542 Cervicalgia: Secondary | ICD-10-CM

## 2021-03-05 DIAGNOSIS — R5383 Other fatigue: Secondary | ICD-10-CM

## 2021-03-05 DIAGNOSIS — R531 Weakness: Principal | ICD-10-CM

## 2021-03-05 DIAGNOSIS — R29898 Other symptoms and signs involving the musculoskeletal system: Secondary | ICD-10-CM

## 2021-03-05 DIAGNOSIS — Z20822 Contact with and (suspected) exposure to covid-19: Secondary | ICD-10-CM | POA: Diagnosis present

## 2021-03-05 DIAGNOSIS — B349 Viral infection, unspecified: Secondary | ICD-10-CM | POA: Diagnosis present

## 2021-03-05 DIAGNOSIS — E876 Hypokalemia: Secondary | ICD-10-CM | POA: Diagnosis present

## 2021-03-05 DIAGNOSIS — R7982 Elevated C-reactive protein (CRP): Secondary | ICD-10-CM | POA: Diagnosis present

## 2021-03-05 DIAGNOSIS — R262 Difficulty in walking, not elsewhere classified: Secondary | ICD-10-CM | POA: Diagnosis present

## 2021-03-05 DIAGNOSIS — T782XXA Anaphylactic shock, unspecified, initial encounter: Secondary | ICD-10-CM | POA: Diagnosis present

## 2021-03-05 DIAGNOSIS — M3329 Polymyositis with other organ involvement: Secondary | ICD-10-CM | POA: Diagnosis present

## 2021-03-05 DIAGNOSIS — M25512 Pain in left shoulder: Secondary | ICD-10-CM | POA: Diagnosis present

## 2021-03-05 DIAGNOSIS — R000 Tachycardia, unspecified: Secondary | ICD-10-CM | POA: Diagnosis present

## 2021-03-05 DIAGNOSIS — X58XXXA Exposure to other specified factors, initial encounter: Secondary | ICD-10-CM | POA: Diagnosis present

## 2021-03-05 DIAGNOSIS — M25511 Pain in right shoulder: Secondary | ICD-10-CM | POA: Diagnosis present

## 2021-03-05 DIAGNOSIS — M130 Polyarthritis, unspecified: Secondary | ICD-10-CM | POA: Diagnosis present

## 2021-03-05 LAB — URINALYSIS, ROUTINE W REFLEX MICROSCOPIC
Bilirubin Urine: NEGATIVE
Glucose, UA: NEGATIVE mg/dL
Ketones, ur: 15 mg/dL — AB
Leukocytes,Ua: NEGATIVE
Nitrite: NEGATIVE
Protein, ur: NEGATIVE mg/dL
Specific Gravity, Urine: 1.009 (ref 1.005–1.030)
pH: 5.5 (ref 5.0–8.0)

## 2021-03-05 LAB — CBC
HCT: 38.6 % (ref 36.0–46.0)
Hemoglobin: 12.8 g/dL (ref 12.0–15.0)
MCH: 29.4 pg (ref 26.0–34.0)
MCHC: 33.2 g/dL (ref 30.0–36.0)
MCV: 88.5 fL (ref 80.0–100.0)
Platelets: 291 10*3/uL (ref 150–400)
RBC: 4.36 MIL/uL (ref 3.87–5.11)
RDW: 12.3 % (ref 11.5–15.5)
WBC: 7.2 10*3/uL (ref 4.0–10.5)
nRBC: 0 % (ref 0.0–0.2)

## 2021-03-05 LAB — HEPATIC FUNCTION PANEL
ALT: 7 U/L (ref 0–44)
AST: 16 U/L (ref 15–41)
Albumin: 4.4 g/dL (ref 3.5–5.0)
Alkaline Phosphatase: 52 U/L (ref 38–126)
Bilirubin, Direct: 0.2 mg/dL (ref 0.0–0.2)
Indirect Bilirubin: 0.8 mg/dL (ref 0.3–0.9)
Total Bilirubin: 1 mg/dL (ref 0.3–1.2)
Total Protein: 8.6 g/dL — ABNORMAL HIGH (ref 6.5–8.1)

## 2021-03-05 LAB — RESP PANEL BY RT-PCR (FLU A&B, COVID) ARPGX2
Influenza A by PCR: NEGATIVE
Influenza B by PCR: NEGATIVE
SARS Coronavirus 2 by RT PCR: NEGATIVE

## 2021-03-05 LAB — BASIC METABOLIC PANEL
Anion gap: 9 (ref 5–15)
BUN: 6 mg/dL (ref 6–20)
CO2: 25 mmol/L (ref 22–32)
Calcium: 9.3 mg/dL (ref 8.9–10.3)
Chloride: 102 mmol/L (ref 98–111)
Creatinine, Ser: 0.49 mg/dL (ref 0.44–1.00)
GFR, Estimated: >60 mL/min (ref >60)
Glucose, Bld: 92 mg/dL (ref 70–99)
Potassium: 3.6 mmol/L (ref 3.5–5.1)
Sodium: 136 mmol/L (ref 135–145)

## 2021-03-05 LAB — AMMONIA: Ammonia: 26 umol/L (ref 9–35)

## 2021-03-05 LAB — SEDIMENTATION RATE: Sed Rate: 40 mm/hr — ABNORMAL HIGH (ref 0–22)

## 2021-03-05 LAB — PREGNANCY, URINE: Preg Test, Ur: NEGATIVE

## 2021-03-05 LAB — C-REACTIVE PROTEIN: CRP: 2.5 mg/dL — ABNORMAL HIGH (ref <1.0)

## 2021-03-05 LAB — CK: Total CK: 55 U/L (ref 38–234)

## 2021-03-05 LAB — TSH: TSH: 2.68 u[IU]/mL (ref 0.350–4.500)

## 2021-03-05 LAB — LACTIC ACID, PLASMA: Lactic Acid, Venous: 0.9 mmol/L (ref 0.5–1.9)

## 2021-03-05 MED ORDER — SODIUM CHLORIDE 0.9 % IV BOLUS
1000.0000 mL | Freq: Once | INTRAVENOUS | Status: AC
  Administered 2021-03-05: 1000 mL via INTRAVENOUS

## 2021-03-05 MED ORDER — SODIUM CHLORIDE 0.9 % IV SOLN
2.0000 g | Freq: Once | INTRAVENOUS | Status: AC
  Administered 2021-03-05: 2 g via INTRAVENOUS
  Filled 2021-03-05: qty 2

## 2021-03-05 MED ORDER — FAMOTIDINE IN NACL 20-0.9 MG/50ML-% IV SOLN
20.0000 mg | Freq: Once | INTRAVENOUS | Status: AC
  Administered 2021-03-05: 20 mg via INTRAVENOUS

## 2021-03-05 MED ORDER — METHYLPREDNISOLONE SODIUM SUCC 125 MG IJ SOLR
125.0000 mg | Freq: Once | INTRAMUSCULAR | Status: AC
  Administered 2021-03-05: 125 mg via INTRAVENOUS

## 2021-03-05 MED ORDER — KETOROLAC TROMETHAMINE 15 MG/ML IJ SOLN
15.0000 mg | Freq: Once | INTRAMUSCULAR | Status: AC
  Administered 2021-03-05: 15 mg via INTRAVENOUS
  Filled 2021-03-05: qty 1

## 2021-03-05 MED ORDER — DIPHENHYDRAMINE HCL 50 MG/ML IJ SOLN
50.0000 mg | Freq: Once | INTRAMUSCULAR | Status: AC
  Administered 2021-03-05: 50 mg via INTRAVENOUS

## 2021-03-05 MED ORDER — EPINEPHRINE 0.3 MG/0.3ML IJ SOAJ
0.3000 mg | Freq: Once | INTRAMUSCULAR | Status: AC
  Administered 2021-03-05: 0.3 mg via INTRAMUSCULAR

## 2021-03-05 MED ORDER — VANCOMYCIN HCL IN DEXTROSE 1-5 GM/200ML-% IV SOLN
1000.0000 mg | Freq: Once | INTRAVENOUS | Status: AC
  Administered 2021-03-05: 1000 mg via INTRAVENOUS
  Filled 2021-03-05: qty 200

## 2021-03-05 MED ORDER — LACTATED RINGERS IV BOLUS
1000.0000 mL | Freq: Once | INTRAVENOUS | Status: AC
  Administered 2021-03-05: 1000 mL via INTRAVENOUS

## 2021-03-05 NOTE — ED Notes (Signed)
ED Provider at bedside. 

## 2021-03-05 NOTE — ED Notes (Signed)
Patient is being discharged from the Urgent Care and sent to the Emergency Department via POA with family. Per L. Romero Liner, patient is in need of higher level of care due to Patient condition, and difficulty moving. Patient is aware and verbalizes understanding of plan of care.  Vitals:   03/05/21 0908  BP: 110/75  Pulse: 72  Resp: 18  Temp: 98.9 F (37.2 C)  SpO2: 97%

## 2021-03-05 NOTE — ED Notes (Signed)
RT responed and placed Pt on 2 L for comfort. Pt rep were between 24-40, but 100% on room air

## 2021-03-05 NOTE — ED Provider Notes (Signed)
  [facility]    CSN: [id number] Arrival date & time: 03/05/21  3536  Patient states that she has severe body aches with weakness initially began 5 days ago, reports recent travel to [place] and having gotten several bug bites while there.  Patient states that her initial symptom was pain and stiffness in her neck, states she has limited range of motion at this time, unable to put chin to chest or rotate head left or right more than about 20 degrees either way.  Patient states she has intermittently noticed joint swelling, most recently her knees, symptoms are bilateral.  Patient states that 3 nights ago she had intense chills without fever, had her husband turned the temperature in the house up to 78 degrees but was still very very cold then felt like she was able to sleep for a few hours, woke up and no longer has a cold sensation.  Patient is afebrile at this time with normal oxygenation, respirations, heart rate and blood pressure.  Per my observation, patient is diffusely weak on exam, unable to stand without assistance from her husband, unable to walk.  Long discussion was had with patient and husband regarding possible etiologies of her sudden onset of weakness and pain.  I advised him of these concern right now is meningitis but this could also be a reactive arthritis possibly due to viral or bacterial infection.  Patient responds that she did take 2 COVID test at home, both been negative.  Patient denies any upper respiratory symptoms, nausea, vomiting, diarrhea.  Patient states she has never had any symptoms like this before.  I have advised patient to go to the emergency room for more emergent evaluation, I explained to them that any test that I can do here will take 24 hours to result and she may need care sooner than that.  When asked, patient does admit to feeling like the weakness and pain is progressing, states she has significant weakness in her feet now.  This also raises my  concern for Guillain-Barr.  I personally assisted patient to her private vehicle via wheelchair, she was unable to stand without significant assistance from her husband.  Husband verbalized that they will go to the emergency room now.   Theadora Rama Scales, [place] 03/05/21 [phone]

## 2021-03-05 NOTE — ED Notes (Signed)
Pt ambulated to the bathroom w/ assistance. HR stayed between 115-130. O2 levels stayed 99-100.

## 2021-03-05 NOTE — ED Provider Notes (Signed)
[facility] Provider Note   CSN: [id number] Arrival date & time: 03/05/21  1008     History Chief Complaint  Patient presents with   Muscle Pain    Rebecca Hughes is a 30 y.o. female.  HPI Patient presents for diffuse myalgias.  History is as follows: Approximately 1 month ago, she was on a business trip to [place] in [place].  At the time of her return from his trip, she developed blistering lesions on her bilateral lower extremities, left shoulder, and back.  She attributed these to bedbugs in the hotel room she was standing out, although she did not have any witnessed bug bites or exposures. She is unaware of any other cause of these lesions. She was seen by dermatologist for this.  She was prescribed doxycycline.  She states that, in order to avoid GI upset, she only took 1 tablet/day, rather than the prescribed 2 tablets/day.  She estimates that she took this doxycycline for over 2 weeks.  Her last dose was approximately 1 week ago.  The blistering lesions ended up breaking and she describes the contents as serous fluid.  These blistering lesions were initially itchy.  Currently, they are scabbed and are not causing her any itchiness or pain.  Approximately 5 days ago, she developed diffuse myalgias and arthralgias.  The symptoms have progressed to the point that she has not walked in the past 24 hours.  Arthralgias are most prominent in her bilateral shoulders and her right knee.  Myalgias are most prominent in her neck and proximal muscles.  She is not aware of any fevers at home.  Prior to these recent symptoms, she was in good health and has no known chronic medical conditions.  She denies any mouth lesions, sore throat, abdominal discomfort, vomiting, diarrhea, or chest discomfort.  She has not had shortness of breath.  Today, she was initially seen in urgent care and instructed to come to the ED.    History reviewed. No pertinent past medical  history.  Patient Active Problem List   Diagnosis Date Noted   Weakness 03/05/2021   ASCUS with positive high risk HPV cervical 04/17/2020   Allergic reaction 03/01/2020   MDD (major depressive disorder) 09/19/2019    Past Surgical History:  Procedure Laterality Date   MASS EXCISION Right 2009   Chest (Benign per patient)     OB History   No obstetric history on file.     Family History  Problem Relation Age of Onset   Allergic rhinitis Sister    Allergic rhinitis Brother    Angioedema Neg Hx    Asthma Neg Hx    Atopy Neg Hx    Eczema Neg Hx    Urticaria Neg Hx    Immunodeficiency Neg Hx     Social History   Tobacco Use   Smoking status: Never   Smokeless tobacco: Never  Vaping Use   Vaping Use: Never used  Substance Use Topics   Alcohol use: Never   Drug use: Never    Home Medications Prior to Admission medications   Medication Sig Start Date End Date Taking? Authorizing Provider  acetaminophen (TYLENOL) 500 MG tablet Take 500 mg by mouth every 6 (six) hours as needed.   Yes [provider]  doxycycline (VIBRAMYCIN) 100 MG capsule Take 100 mg by mouth 2 (two) times daily. 02/11/21  Yes [provider]  [place] Patient states took otc flu med   Yes [provider]  ibuprofen (ADVIL) 200 MG tablet Take 200 mg by mouth every 6 (six) hours as needed.    [provider]    Allergies    Patient has no known allergies.  Review of Systems   Review of Systems  Constitutional:  Positive for activity change and fatigue. Negative for chills and fever.  HENT:  Negative for congestion, ear pain, sinus pain, sore throat, trouble swallowing and voice change.   Eyes:  Negative for photophobia, pain, redness and visual disturbance.  Respiratory:  Negative for cough, chest tightness, shortness of breath and wheezing.   Cardiovascular:  Negative for chest pain and palpitations.  Gastrointestinal:  Negative for  abdominal pain, nausea and vomiting.  Genitourinary:  Negative for dysuria, flank pain, hematuria, pelvic pain and urgency.  Musculoskeletal:  Positive for arthralgias, joint swelling, myalgias, neck pain and neck stiffness. Negative for back pain.  Skin:  Positive for rash and wound. Negative for color change.  Allergic/Immunologic: Negative for immunocompromised state.  Neurological:  Positive for weakness (Generalized) and headaches. Negative for dizziness, seizures, syncope, facial asymmetry, speech difficulty, light-headedness and numbness.  Hematological:  Does not bruise/bleed easily.  Psychiatric/Behavioral:  Negative for confusion and decreased concentration.   All other systems reviewed and are negative.  Physical Exam Updated Vital Signs BP 115/61 (BP Location: Left Arm)   Pulse 65   Temp 98.2 F (36.8 C) (Oral)   Resp (!) 27   Ht _0  (1.6 m)   Wt 50.3 kg   LMP 02/02/2021 (Approximate)   SpO2 100%   BMI 19.66 kg/m   Physical Exam Vitals and nursing note reviewed.  Constitutional:      General: She is not in acute distress.    Appearance: Normal appearance. She is well-developed and normal weight. She is ill-appearing and diaphoretic. She is not toxic-appearing.  HENT:     Head: Normocephalic and atraumatic.     Right Ear: External ear normal.     Left Ear: External ear normal.     Nose: Nose normal.     Mouth/Throat:     Mouth: Mucous membranes are moist.     Pharynx: Oropharynx is clear. No oropharyngeal exudate or posterior oropharyngeal erythema.     Comments: No intraoral lesions Eyes:     General: No scleral icterus.    Extraocular Movements: Extraocular movements intact.     Conjunctiva/sclera: Conjunctivae normal.  Cardiovascular:     Rate and Rhythm: Regular rhythm. Tachycardia present.     Heart sounds: No murmur heard. Pulmonary:     Effort: Pulmonary effort is normal. No respiratory distress.     Breath sounds: Normal breath sounds. No wheezing  or rales.  Chest:     Chest wall: No tenderness.  Abdominal:     Palpations: Abdomen is soft.     Tenderness: There is no abdominal tenderness. There is no right CVA tenderness or left CVA tenderness.  Musculoskeletal:        General: Tenderness present. No swelling or deformity.     Cervical back: Rigidity and tenderness present.     Right lower leg: No edema.     Left lower leg: No edema.  Skin:    General: Skin is warm.     Coloration: Skin is not jaundiced or pale.  Neurological:     General: No focal deficit present.     Mental Status: She is alert and oriented to person, place, and time.     Cranial Nerves: No cranial  nerve deficit.     Sensory: No sensory deficit.     Motor: No weakness.     Coordination: Coordination normal.  Psychiatric:        Mood and Affect: Mood normal.        Behavior: Behavior normal.         ED Results / Procedures / Treatments   Labs (all labs ordered are listed, but only abnormal results are displayed) Labs Reviewed  URINALYSIS, ROUTINE W REFLEX MICROSCOPIC - Abnormal; Notable for the following components:      Result Value   Hgb urine dipstick TRACE (*)    Ketones, ur 15 (*)    Bacteria, UA RARE (*)    All other components within normal limits  SEDIMENTATION RATE - Abnormal; Notable for the following components:   Sed Rate 40 (*)    All other components within normal limits  C-REACTIVE PROTEIN - Abnormal; Notable for the following components:   CRP 2.5 (*)    All other components within normal limits  HEPATIC FUNCTION PANEL - Abnormal; Notable for the following components:   Total Protein 8.6 (*)    All other components within normal limits  CBC WITH DIFFERENTIAL/PLATELET - Abnormal; Notable for the following components:   HCT 35.8 (*)    Lymphs Abs 0.6 (*)    All other components within normal limits  COMPREHENSIVE METABOLIC PANEL - Abnormal; Notable for the following components:   Glucose, Bld 109 (*)    Calcium 8.4 (*)     AST 13 (*)    All other components within normal limits  SEDIMENTATION RATE - Abnormal; Notable for the following components:   Sed Rate 34 (*)    All other components within normal limits  C-REACTIVE PROTEIN - Abnormal; Notable for the following components:   CRP 3.1 (*)    All other components within normal limits  CULTURE, BLOOD (ROUTINE X 2)  CULTURE, BLOOD (ROUTINE X 2)  RESP PANEL BY RT-PCR (FLU A&B, COVID) ARPGX2  BASIC METABOLIC PANEL  CBC  PREGNANCY, URINE  CK  LACTIC ACID, PLASMA  TSH  AMMONIA  ANTINUCLEAR ANTIBODIES, IFA  ANCA PROFILE    EKG EKG Interpretation  Date/Time:  Tuesday March 05 2021 16:31:53 EDT Ventricular Rate:  94 PR Interval:  139 QRS Duration: 94 QT Interval:  322 QTC Calculation: 403 R Axis:   101 Text Interpretation: Sinus rhythm Consider right ventricular hypertrophy No prior ECG for comparison. No STEMI Confirmed by Antony Blackbird [phone]) on 03/05/2021 4:33:13 PM  Radiology DG Chest Portable 1 View  Result Date: 03/05/2021 CLINICAL DATA:  Fever. EXAM: PORTABLE CHEST 1 VIEW COMPARISON:  None. FINDINGS: The heart size and mediastinal contours are within normal limits. Both lungs are clear. The visualized skeletal structures are unremarkable. IMPRESSION: No active disease. Electronically Signed   By: Ronney Asters M.D.   On: 03/05/2021 17:22    Procedures Procedures   Medications Ordered in ED Medications  lactated ringers bolus 1,000 mL (0 mLs Intravenous Stopped 03/05/21 1651)  ketorolac (TORADOL) 15 MG/ML injection 15 mg (15 mg Intravenous Given 03/05/21 1429)  sodium chloride 0.9 % bolus 1,000 mL (0 mLs Intravenous Stopped 03/05/21 1838)  vancomycin (VANCOCIN) IVPB 1000 mg/200 mL premix (0 mg Intravenous Stopped 03/05/21 2048)  ceFEPIme (MAXIPIME) 2 g in sodium chloride 0.9 % 100 mL IVPB (0 g Intravenous Stopped 03/05/21 1751)  sodium chloride 0.9 % bolus 1,000 mL (0 mLs Intravenous Stopped 03/06/21 0025)  diphenhydrAMINE  (BENADRYL) injection 50 mg (  50 mg Intravenous Given 03/05/21 2047)  famotidine (PEPCID) IVPB 20 mg premix (0 mg Intravenous Stopped 03/06/21 0025)  methylPREDNISolone sodium succinate (SOLU-MEDROL) 125 mg/2 mL injection 125 mg (125 mg Intravenous Given 03/05/21 2047)  EPINEPHrine (EPI-PEN) injection 0.3 mg (0.3 mg Intramuscular Given 03/05/21 2048)  sodium chloride 0.9 % bolus 1,000 mL (0 mLs Intravenous Stopped 03/06/21 0025)    ED Course  I have reviewed the triage vital signs and the nursing notes.  Pertinent labs & imaging results that were available during my care of the patient were reviewed by me and considered in my medical decision making (see chart for details).    MDM Rules/Calculators/A&P                          Patient presents for 5 days of worsening diffuse myalgias and arthralgias.  This has caused her significant fatigue and generalized weakness.  She reports she has not walked in the past 24 hours.  On arrival, she is mildly diaphoretic.  Vital signs are notable for tachycardia.  She is afebrile.  On initial exam, she does feel warm to the touch.  Temperature was checked and found be 100 degrees.  Broad laboratory work-up was initiated to identify the source of her symptoms which, at this point, does appear to be infectious or autoimmune.  The lesions on her lower extremities are concerning for possible infection that has now become disseminated.  Patient was given IV fluids and Toradol.  On reassessment, her heart rate normalized.  She did report some improvement in her symptoms.  Initial lab work was unremarkable.  There is no leukocytosis, no thrombocytopenia.  Etiology of her symptoms remains unclear at this time.  ESR was elevated at 40.  When patient sat up in the bed, she became tachycardic and tachypneic.  Additional IV fluids were ordered.  Patient will require further assessment after further hydration.  Care of patient was signed out to oncoming ED provider.  Final  Clinical Impression(s) / ED Diagnoses Final diagnoses:  Generalized weakness  Other fatigue  Myalgia    Rx / DC Orders ED Discharge Orders     None        Godfrey Pick, MD 03/06/21 1451

## 2021-03-05 NOTE — ED Provider Notes (Signed)
4:20 PM Care assumed from Dr. Doren Custard.  At time of transfer care, patient is awaiting to ambulate and have a p.o. challenge.  Her work-up was overall reassuring in regards that we did not find evidence of rhabdo, significant lecture light abnormality, kidney injury, Or convincing evidence of urinary tract infection.  Her CBC was completely reassuring as was her BMP.  COVID and flu negative.  Pregnancy test negative.  ESR was slightly elevated however previous team suspect this is either a viral infection, RMSF, or cellulitis that could cause elevation in the acute phase reactants of ESR and CRP.  Just prior to previous team leaving, they tried to ambulate patient and she was too weak to walk safely without significant assistance.    She still feels worsening myalgias and now with a temperature of 100 degrees.  Previous team feels that she is now not safe to go home and has some sort of disseminated infection that is causing her to have the profound diffuse weakness.  She reportedly has normal mental status and is not having headache or neck pain or neck stiffness.  He does not feel that this is an encephalitis nor does she need an LP at this time.  He did discuss that the rash could be suggestive of monkey pox versus other poxvirus.  However, she did not have any vesicles that was still fluid-filled as they all appeared to been scabbed over.  Previous team plan to give broad-spectrum antibiotics, more fluids, and admit for disseminated infection preventing patient from standing and ambulating as when she stood her heart rate went into the 130s and she could not walk safely on her own.  4:29 PM Patient will also have chest x-ray due to increased work of the breathing and now being on 2 L, will get hepatic function to look for significant liver abnormalities, and also get lactic acid.   Will call for admission for unknown infection leading to profound fatigue, malaise, myalgias, and inability to ambulate  safely at home.   6:10 PM The rest of her work-up continue to look normal.  Lactic acid nonelevated.  Ammonia normal.  TSH is still in process.  CRP still in process.  Hepatic function normal.  CK normal.  Given her worsening symptoms and continued tachycardia, fatigue, and difficulty with ambulation, patient received the broad-spectrum antibiotics and we will call for admission.  She is not complaining of headache or neck pain at this time and based on the previous discussion of the previous provider, patient agrees with holding on lumbar puncture at this time.  She reports her weakness is all over making Guillain-Barr less suspected as is meningitis.  She does not have a lactic acid or leukocytosis.  Will call for admission as she does not appear safe to go home at this time.   Medicine team called and patient was admitted for further work-up and management.  8:51 PM Patient just finished the vancomycin and was called in for allergic reaction.  Patient was having feeling like her throat was closing, she was having itching, and skin was starting to look red.  She felt that her face and lips were swelling.  I did not hear stridor or wheezing but she started having more tachycardia and difficulty breathing.  Decision was made to treat for anaphylactic reaction and she was given steroids, antihistamines, and IM epinephrine.  Although her new symptoms seem to be related to the vancomycin and for allergic reaction, I again had a discussion with her  where we discussed the possibility of obtaining lumbar puncture before admission.  She is having some neck pain but on reassessment her fever is gone.  I offered her lumbar puncture and she said no.  Patient symptoms seem to be improving after epinephrine and steroids.  We will continue to monitor before she gets admitted.   Clinical Impression: 1. Generalized weakness   2. Other fatigue   3. Myalgia     Disposition: Admit  This note was  prepared with assistance of Dragon voice recognition software. Occasional wrong-word or sound-a-like substitutions may have occurred due to the inherent limitations of voice recognition software.       Rebecca Hughes, Rebecca Allegra, MD 03/06/21 [phone]

## 2021-03-05 NOTE — ED Triage Notes (Signed)
Pt reports having generalized body aches, she states feeling like her "knees are swelling up".  Started: last Thursday, but got worse last night

## 2021-03-05 NOTE — ED Notes (Signed)
Pt stated that she has trouble moving her chin on to her chest. I asked Pt to do same and as she tried she stated that she was having difficulty doing so. I had her stop until MD arrived for further evaluation. Pt stated that blood was drawn eariler this mornigng

## 2021-03-05 NOTE — ED Notes (Signed)
Called about latic and just now running latic in Lab.

## 2021-03-05 NOTE — ED Triage Notes (Signed)
Sent from UC. C/o generalized muscle and joint pain x 5 days. States it started in her neck and is now all over. Denies N/V/D

## 2021-03-06 DIAGNOSIS — R531 Weakness: Secondary | ICD-10-CM | POA: Diagnosis not present

## 2021-03-06 DIAGNOSIS — M791 Myalgia, unspecified site: Secondary | ICD-10-CM | POA: Diagnosis present

## 2021-03-06 DIAGNOSIS — M130 Polyarthritis, unspecified: Secondary | ICD-10-CM | POA: Diagnosis not present

## 2021-03-06 DIAGNOSIS — R21 Rash and other nonspecific skin eruption: Secondary | ICD-10-CM | POA: Diagnosis not present

## 2021-03-06 DIAGNOSIS — M25461 Effusion, right knee: Secondary | ICD-10-CM | POA: Diagnosis not present

## 2021-03-06 DIAGNOSIS — M02361 Reiter's disease, right knee: Secondary | ICD-10-CM | POA: Diagnosis not present

## 2021-03-06 DIAGNOSIS — M7918 Myalgia, other site: Secondary | ICD-10-CM | POA: Diagnosis not present

## 2021-03-06 DIAGNOSIS — Z20822 Contact with and (suspected) exposure to covid-19: Secondary | ICD-10-CM | POA: Diagnosis not present

## 2021-03-06 DIAGNOSIS — E876 Hypokalemia: Secondary | ICD-10-CM | POA: Diagnosis not present

## 2021-03-06 DIAGNOSIS — B349 Viral infection, unspecified: Secondary | ICD-10-CM | POA: Diagnosis not present

## 2021-03-06 DIAGNOSIS — R000 Tachycardia, unspecified: Secondary | ICD-10-CM | POA: Diagnosis not present

## 2021-03-06 DIAGNOSIS — M436 Torticollis: Secondary | ICD-10-CM | POA: Diagnosis not present

## 2021-03-06 DIAGNOSIS — M3329 Polymyositis with other organ involvement: Secondary | ICD-10-CM | POA: Diagnosis not present

## 2021-03-06 DIAGNOSIS — X58XXXA Exposure to other specified factors, initial encounter: Secondary | ICD-10-CM | POA: Diagnosis not present

## 2021-03-06 DIAGNOSIS — M02362 Reiter's disease, left knee: Secondary | ICD-10-CM | POA: Diagnosis not present

## 2021-03-06 DIAGNOSIS — M25511 Pain in right shoulder: Secondary | ICD-10-CM | POA: Diagnosis not present

## 2021-03-06 DIAGNOSIS — R262 Difficulty in walking, not elsewhere classified: Secondary | ICD-10-CM | POA: Diagnosis not present

## 2021-03-06 DIAGNOSIS — W57XXXA Bitten or stung by nonvenomous insect and other nonvenomous arthropods, initial encounter: Secondary | ICD-10-CM | POA: Diagnosis not present

## 2021-03-06 DIAGNOSIS — M25512 Pain in left shoulder: Secondary | ICD-10-CM | POA: Diagnosis not present

## 2021-03-06 DIAGNOSIS — T782XXA Anaphylactic shock, unspecified, initial encounter: Secondary | ICD-10-CM | POA: Diagnosis not present

## 2021-03-06 DIAGNOSIS — R7982 Elevated C-reactive protein (CRP): Secondary | ICD-10-CM | POA: Diagnosis not present

## 2021-03-06 LAB — COMPREHENSIVE METABOLIC PANEL
ALT: 6 U/L (ref 0–44)
AST: 13 U/L — ABNORMAL LOW (ref 15–41)
Albumin: 3.7 g/dL (ref 3.5–5.0)
Alkaline Phosphatase: 42 U/L (ref 38–126)
Anion gap: 11 (ref 5–15)
BUN: 7 mg/dL (ref 6–20)
CO2: 22 mmol/L (ref 22–32)
Calcium: 8.4 mg/dL — ABNORMAL LOW (ref 8.9–10.3)
Chloride: 106 mmol/L (ref 98–111)
Creatinine, Ser: 0.5 mg/dL (ref 0.44–1.00)
GFR, Estimated: >60 mL/min (ref >60)
Glucose, Bld: 109 mg/dL — ABNORMAL HIGH (ref 70–99)
Potassium: 3.8 mmol/L (ref 3.5–5.1)
Sodium: 139 mmol/L (ref 135–145)
Total Bilirubin: 0.6 mg/dL (ref 0.3–1.2)
Total Protein: 7.1 g/dL (ref 6.5–8.1)

## 2021-03-06 LAB — CBC WITH DIFFERENTIAL/PLATELET
Abs Immature Granulocytes: 0.02 10*3/uL (ref 0.00–0.07)
Basophils Absolute: 0 10*3/uL (ref 0.0–0.1)
Basophils Relative: 0 %
Eosinophils Absolute: 0 10*3/uL (ref 0.0–0.5)
Eosinophils Relative: 0 %
HCT: 35.8 % — ABNORMAL LOW (ref 36.0–46.0)
Hemoglobin: 12.1 g/dL (ref 12.0–15.0)
Immature Granulocytes: 0 %
Lymphocytes Relative: 8 %
Lymphs Abs: 0.6 10*3/uL — ABNORMAL LOW (ref 0.7–4.0)
MCH: 29.9 pg (ref 26.0–34.0)
MCHC: 33.8 g/dL (ref 30.0–36.0)
MCV: 88.4 fL (ref 80.0–100.0)
Monocytes Absolute: 0.1 10*3/uL (ref 0.1–1.0)
Monocytes Relative: 1 %
Neutro Abs: 6.6 10*3/uL (ref 1.7–7.7)
Neutrophils Relative %: 91 %
Platelets: 310 10*3/uL (ref 150–400)
RBC: 4.05 MIL/uL (ref 3.87–5.11)
RDW: 12 % (ref 11.5–15.5)
WBC: 7.2 10*3/uL (ref 4.0–10.5)
nRBC: 0 % (ref 0.0–0.2)

## 2021-03-06 LAB — C-REACTIVE PROTEIN: CRP: 3.1 mg/dL — ABNORMAL HIGH (ref <1.0)

## 2021-03-06 LAB — SEDIMENTATION RATE: Sed Rate: 34 mm/hr — ABNORMAL HIGH (ref 0–22)

## 2021-03-06 MED ORDER — ENOXAPARIN SODIUM 40 MG/0.4ML IJ SOSY
40.0000 mg | PREFILLED_SYRINGE | INTRAMUSCULAR | Status: DC
  Administered 2021-03-06 – 2021-03-07 (×2): 40 mg via SUBCUTANEOUS
  Filled 2021-03-06 (×5): qty 0.4

## 2021-03-06 MED ORDER — IBUPROFEN 400 MG PO TABS
400.0000 mg | ORAL_TABLET | Freq: Four times a day (QID) | ORAL | Status: DC | PRN
  Filled 2021-03-06: qty 1

## 2021-03-06 NOTE — ED Provider Notes (Signed)
1:24 PM I had long discussion with patient. She is still currently boarding, waiting on a bed. She feels somewhat better than yesterday but not much. Feels too weak to walk. There is generalized joint pain, worst in knees. Feels like left knee might be a little swollen. It is mildly swollen but no apparent effusion. Offered xrays, she declines. We discussed her fairly negative workup except for CRP and ESR being mildly elevated.  We discussed how multiple providers have offered LP and given her extremity weakness this would not be unreasonable.  While it seems unlikely she has bacterial meningitis, could have encephalitis or other spinal cord pathology that LP could help with.  There is no MRI available at this facility.  She declines.  I do not think this is unreasonable.  Her antibiotics were one-time doses last night and while its unclear what exactly we are treating I offered to continue this but she also declines this.  Perhaps there is a rheumatologic component given the low-grade fever and diffuse joint pain.  Will consult hospitalist as she is still boarding to see if there are any other labs that we can start with.  1:55 PM Discussed with Dr. Marylyn Ishihara.  He recommends ANA, ANCA.  Otherwise, hopefully patient will get a bed soon   Sherwood Gambler, MD 03/06/21 1520

## 2021-03-06 NOTE — ED Notes (Signed)
Pt starts to feel flushing - states her throat is tight.  Her HR was 160s.  Notified EDP. Pt was having allergic reaction to vancomycin. Pt received epi, famotidine, bendraryl.   Pt GCS 15.

## 2021-03-06 NOTE — H&P (Signed)
History and Physical    Rebecca Hughes AOZ:[id number] DOB: 23-May-1990 DOA: 03/05/2021  PCP: Pcp, No  Patient coming from: Home  Chief Complaint: generalized weakness, fevers, chills, arthralgias.  HPI: Rebecca Hughes is a 30 y.o. female with no significant medical history. Presenting with fevers, chills, arthralgias. Her symptoms started a month ago. She was traveling for business. After she got home, she noticed what appeared to be bite marks running from her feet to her thighs. She eventually had some on her trunk. She saw a dermatologist and was treated for bed bugs. This did not seem to improved her symptoms. Her bites blistered and ruptured. She reports that her husband did not have any symptoms and was in direct exposure to her ruptures. She reports that approximately a week ago, she felt chills and neck pain. She felt stiff in the shoulders the next day. She found it difficult to rotate her neck over her shoulders. She began to feel aches in her knees and fingers. Several days later, she felt tingling in her fingers and found it difficult to get around without assistance. She became concerned and went to urgent care. They recommended that she come to the ED. She denies any other aggravating or alleviating factors.     ED Course: Lab work only showed an elevated CRP. She was given epi, pepcid, solumedrol and vanc. TRH was called for admission.   Review of Systems:  Review of systems is otherwise negative for all not mentioned in HPI.   PMHx History reviewed. No pertinent past medical history.  PSHx Past Surgical History:  Procedure Laterality Date   MASS EXCISION Right 2009   Chest (Benign per patient)    SocHx  reports that she has never smoked. She has never used smokeless tobacco. She reports that she does not drink alcohol and does not use drugs.  No Known Allergies  FamHx Family History  Problem Relation Age of Onset   Allergic rhinitis Sister    Allergic rhinitis Brother     Angioedema Neg Hx    Asthma Neg Hx    Atopy Neg Hx    Eczema Neg Hx    Urticaria Neg Hx    Immunodeficiency Neg Hx     Prior to Admission medications   Medication Sig Start Date End Date Taking? Authorizing Provider  acetaminophen (TYLENOL) 500 MG tablet Take 500 mg by mouth every 6 (six) hours as needed.   Yes [provider]  doxycycline (VIBRAMYCIN) 100 MG capsule Take 100 mg by mouth 2 (two) times daily. 02/11/21  Yes [provider]  OVER THE COUNTER MEDICATION Patient states took otc flu med   Yes [provider]  ibuprofen (ADVIL) 200 MG tablet Take 200 mg by mouth every 6 (six) hours as needed.    [provider]    Physical Exam: Vitals:   03/06/21 1244 03/06/21 1416 03/06/21 1556 03/06/21 1612  BP: 103/70 115/61 111/73   Pulse: 75 65 73   Resp: 18 (!) 27 18   Temp: 98.2 F (36.8 C) 98.2 F (36.8 C) 98.3 F (36.8 C)   TempSrc: Oral Oral Oral   SpO2: 100% 100% 100%   Weight:    50.3 kg  Height:    5\' 3"  (1.6 m)    General: 30 y.o. female resting in bed in NAD Eyes: PERRL, normal sclera ENMT: Nares patent w/o discharge, orophaynx clear, dentition normal, ears w/o discharge/lesions/ulcers Neck: Supple, trachea midline Cardiovascular: RRR, +S1, S2, no m/g/r, equal pulses  throughout Respiratory: CTABL, no w/r/r, normal WOB GI: BS+, NDNT, no masses noted, no organomegaly noted MSK: No e/c/c Skin: No rashes, bruises, ulcerations noted Neuro: A&O x 3, no focal deficits Psyc: Appropriate interaction and affect, calm/cooperative  Labs on Admission: I have personally reviewed following labs and imaging studies  CBC: Recent Labs  Lab 03/05/21 1052 03/06/21 0739  WBC 7.2 7.2  NEUTROABS  --  6.6  HGB 12.8 12.1  HCT 38.6 35.8*  MCV 88.5 88.4  PLT 291 310   Basic Metabolic Panel: Recent Labs  Lab 03/05/21 1052 03/06/21 0739  NA 136 139  K 3.6 3.8  CL 102 106  CO2 25 22  GLUCOSE 92 109*  BUN 6 7  CREATININE 0.49 0.50   CALCIUM 9.3 8.4*   GFR: Estimated Creatinine Clearance: 81.7 mL/min (by C-G formula based on SCr of 0.5 mg/dL). Liver Function Tests: Recent Labs  Lab 03/05/21 1427 03/06/21 0739  AST 16 13*  ALT 7 6  ALKPHOS 52 42  BILITOT 1.0 0.6  PROT 8.6* 7.1  ALBUMIN 4.4 3.7   No results for input(s): LIPASE, AMYLASE in the last 168 hours. Recent Labs  Lab 03/05/21 1700  AMMONIA 26   Coagulation Profile: No results for input(s): INR, PROTIME in the last 168 hours. Cardiac Enzymes: Recent Labs  Lab 03/05/21 1052  CKTOTAL 55   BNP (last 3 results) No results for input(s): PROBNP in the last 8760 hours. HbA1C: No results for input(s): HGBA1C in the last 72 hours. CBG: No results for input(s): GLUCAP in the last 168 hours. Lipid Profile: No results for input(s): CHOL, HDL, LDLCALC, TRIG, CHOLHDL, LDLDIRECT in the last 72 hours. Thyroid Function Tests: Recent Labs    03/05/21 1427  TSH 2.680   Anemia Panel: No results for input(s): VITAMINB12, FOLATE, FERRITIN, TIBC, IRON, RETICCTPCT in the last 72 hours. Urine analysis:    Component Value Date/Time   COLORURINE YELLOW 03/05/2021 1052   APPEARANCEUR CLEAR 03/05/2021 1052   LABSPEC 1.009 03/05/2021 1052   PHURINE 5.5 03/05/2021 1052   GLUCOSEU NEGATIVE 03/05/2021 1052   HGBUR TRACE (A) 03/05/2021 1052   BILIRUBINUR NEGATIVE 03/05/2021 1052   KETONESUR 15 (A) 03/05/2021 1052   PROTEINUR NEGATIVE 03/05/2021 1052   NITRITE NEGATIVE 03/05/2021 1052   LEUKOCYTESUR NEGATIVE 03/05/2021 1052    Radiological Exams on Admission: DG Chest Portable 1 View  Result Date: 03/05/2021 CLINICAL DATA:  Fever. EXAM: PORTABLE CHEST 1 VIEW COMPARISON:  None. FINDINGS: The heart size and mediastinal contours are within normal limits. Both lungs are clear. The visualized skeletal structures are unremarkable. IMPRESSION: No active disease. Electronically Signed   By: Darliss Cheney M.D.   On: 03/05/2021 17:22    EKG: Independently reviewed.  Sinus, no st elevation  Assessment/Plan Generalized weakness Polyarthralgia Petechial Rash     - placed in obs med-surg     - ANA, ANCA pending     - check EBV, HIV, CMV      - hold off on further steroids, abx for right now     - she doesn't have focality on exam, will hold on MRI     - if ANA, ANCA unrevealing, consider an LP     - ?ID consult?     - continue fluids for now   DVT prophylaxis: lovenox  Code Status: FULL  Family Communication: none at bedside  Consults called: None   Status is: Observation  The patient remains OBS appropriate and will d/c before 2  midnights.  Time spent coordinating admission: 60 minutes  Jamyson Jirak A Ramsey Guadamuz DO [facility]  If 7PM-7AM, please contact night-coverage [email or web address]  03/06/2021, 4:39 PM

## 2021-03-06 NOTE — ED Notes (Signed)
Pt is now feel relief. Denies throat tightness /  SOB. Pt is resting on bed.

## 2021-03-07 ENCOUNTER — Observation Stay (HOSPITAL_COMMUNITY): Payer: BLUE CROSS/BLUE SHIELD

## 2021-03-07 DIAGNOSIS — R000 Tachycardia, unspecified: Secondary | ICD-10-CM | POA: Diagnosis present

## 2021-03-07 DIAGNOSIS — M25512 Pain in left shoulder: Secondary | ICD-10-CM | POA: Diagnosis present

## 2021-03-07 DIAGNOSIS — R21 Rash and other nonspecific skin eruption: Secondary | ICD-10-CM | POA: Diagnosis present

## 2021-03-07 DIAGNOSIS — W57XXXA Bitten or stung by nonvenomous insect and other nonvenomous arthropods, initial encounter: Secondary | ICD-10-CM | POA: Diagnosis present

## 2021-03-07 DIAGNOSIS — R509 Fever, unspecified: Secondary | ICD-10-CM

## 2021-03-07 DIAGNOSIS — R531 Weakness: Secondary | ICD-10-CM | POA: Diagnosis not present

## 2021-03-07 DIAGNOSIS — R5383 Other fatigue: Secondary | ICD-10-CM | POA: Diagnosis not present

## 2021-03-07 DIAGNOSIS — Z20822 Contact with and (suspected) exposure to covid-19: Secondary | ICD-10-CM | POA: Diagnosis present

## 2021-03-07 DIAGNOSIS — M130 Polyarthritis, unspecified: Secondary | ICD-10-CM | POA: Diagnosis present

## 2021-03-07 DIAGNOSIS — M25511 Pain in right shoulder: Secondary | ICD-10-CM | POA: Diagnosis present

## 2021-03-07 DIAGNOSIS — M7918 Myalgia, other site: Secondary | ICD-10-CM | POA: Diagnosis present

## 2021-03-07 DIAGNOSIS — X58XXXA Exposure to other specified factors, initial encounter: Secondary | ICD-10-CM | POA: Diagnosis present

## 2021-03-07 DIAGNOSIS — M3329 Polymyositis with other organ involvement: Secondary | ICD-10-CM | POA: Diagnosis present

## 2021-03-07 DIAGNOSIS — M02362 Reiter's disease, left knee: Secondary | ICD-10-CM | POA: Diagnosis present

## 2021-03-07 DIAGNOSIS — E876 Hypokalemia: Secondary | ICD-10-CM | POA: Diagnosis present

## 2021-03-07 DIAGNOSIS — R29898 Other symptoms and signs involving the musculoskeletal system: Secondary | ICD-10-CM | POA: Diagnosis present

## 2021-03-07 DIAGNOSIS — M436 Torticollis: Secondary | ICD-10-CM | POA: Diagnosis present

## 2021-03-07 DIAGNOSIS — M791 Myalgia, unspecified site: Secondary | ICD-10-CM | POA: Diagnosis present

## 2021-03-07 DIAGNOSIS — M542 Cervicalgia: Secondary | ICD-10-CM | POA: Diagnosis not present

## 2021-03-07 DIAGNOSIS — B349 Viral infection, unspecified: Secondary | ICD-10-CM | POA: Diagnosis present

## 2021-03-07 DIAGNOSIS — R262 Difficulty in walking, not elsewhere classified: Secondary | ICD-10-CM | POA: Diagnosis present

## 2021-03-07 DIAGNOSIS — T782XXA Anaphylactic shock, unspecified, initial encounter: Secondary | ICD-10-CM | POA: Diagnosis present

## 2021-03-07 DIAGNOSIS — M25461 Effusion, right knee: Secondary | ICD-10-CM | POA: Diagnosis present

## 2021-03-07 DIAGNOSIS — R7982 Elevated C-reactive protein (CRP): Secondary | ICD-10-CM | POA: Diagnosis present

## 2021-03-07 DIAGNOSIS — M02361 Reiter's disease, right knee: Secondary | ICD-10-CM | POA: Diagnosis present

## 2021-03-07 LAB — ANCA PROFILE
Anti-MPO Antibodies: <0.2 units (ref 0.0–0.9)
Anti-PR3 Antibodies: <0.2 units (ref 0.0–0.9)
Atypical P-ANCA titer: <1:20 {titer}
C-ANCA: <1:20 {titer}
P-ANCA: <1:20 {titer}

## 2021-03-07 LAB — COMPREHENSIVE METABOLIC PANEL
ALT: 9 U/L (ref 0–44)
AST: 15 U/L (ref 15–41)
Albumin: 3.4 g/dL — ABNORMAL LOW (ref 3.5–5.0)
Alkaline Phosphatase: 38 U/L (ref 38–126)
Anion gap: 5 (ref 5–15)
BUN: 13 mg/dL (ref 6–20)
CO2: 27 mmol/L (ref 22–32)
Calcium: 8.7 mg/dL — ABNORMAL LOW (ref 8.9–10.3)
Chloride: 107 mmol/L (ref 98–111)
Creatinine, Ser: 0.56 mg/dL (ref 0.44–1.00)
GFR, Estimated: >60 mL/min (ref >60)
Glucose, Bld: 103 mg/dL — ABNORMAL HIGH (ref 70–99)
Potassium: 3.3 mmol/L — ABNORMAL LOW (ref 3.5–5.1)
Sodium: 139 mmol/L (ref 135–145)
Total Bilirubin: 0.8 mg/dL (ref 0.3–1.2)
Total Protein: 6.7 g/dL (ref 6.5–8.1)

## 2021-03-07 LAB — CBC
HCT: 34.4 % — ABNORMAL LOW (ref 36.0–46.0)
Hemoglobin: 11.3 g/dL — ABNORMAL LOW (ref 12.0–15.0)
MCH: 29.7 pg (ref 26.0–34.0)
MCHC: 32.8 g/dL (ref 30.0–36.0)
MCV: 90.3 fL (ref 80.0–100.0)
Platelets: 280 10*3/uL (ref 150–400)
RBC: 3.81 MIL/uL — ABNORMAL LOW (ref 3.87–5.11)
RDW: 12.3 % (ref 11.5–15.5)
WBC: 9.4 10*3/uL (ref 4.0–10.5)
nRBC: 0 % (ref 0.0–0.2)

## 2021-03-07 LAB — TSH: TSH: 3.714 u[IU]/mL (ref 0.350–4.500)

## 2021-03-07 LAB — ECHOCARDIOGRAM COMPLETE
Area-P 1/2: 3.89 cm2
Height: 63 in
S' Lateral: 2.7 cm
Weight: 1774.26 oz

## 2021-03-07 LAB — VITAMIN B12: Vitamin B-12: 379 pg/mL (ref 180–914)

## 2021-03-07 LAB — HIV ANTIBODY (ROUTINE TESTING W REFLEX): HIV Screen 4th Generation wRfx: NONREACTIVE

## 2021-03-07 MED ORDER — METHYLPREDNISOLONE SODIUM SUCC 40 MG IJ SOLR
40.0000 mg | Freq: Two times a day (BID) | INTRAMUSCULAR | Status: DC
  Administered 2021-03-07 – 2021-03-12 (×11): 40 mg via INTRAVENOUS
  Filled 2021-03-07 (×11): qty 1

## 2021-03-07 MED ORDER — POTASSIUM CHLORIDE CRYS ER 20 MEQ PO TBCR
40.0000 meq | EXTENDED_RELEASE_TABLET | Freq: Once | ORAL | Status: AC
  Administered 2021-03-07: 40 meq via ORAL
  Filled 2021-03-07: qty 2

## 2021-03-07 NOTE — Consult Note (Signed)
Neurology Consultation  Reason for Consult: Leg weakness.  Referring Physician: C. Gherghe, MD.   CC: Rash in past month, on Doxy, now with descending generalized weakness.   History is obtained from: patient.   HPI: Rebecca Hughes is a 30 y.o. female with no significant past medical history who presented to ED 2 days ago with complaints of fevers, chills, arthralgias as well as lower extremity weakness.  About a month ago, while traveling for business, she noticed a rash mainly located on her legs, initially looking like bug bites but then progressed to form vesicles with a clear liquid, eventually popped and started to scab over.  She had a few lesions on her back, thighs, arms but mostly were located on the legs/feet.  She is also been complaining of stiffness in her shoulders, neck, having difficulties rotating her neck.  Has been having swelling in her knees, pain, as well as progressive weakness to the point that she was unable to walk and came to the hospital. + history of fever and chills recently but no identified illness.    Patient states that her feeling of weakness began in her chest and UEs, then descended to her legs. She is able to ambulate independently, but feels like her legs are heavy, like after heavy exercise. She reports tingling all over her body yesterday lasting 30 mins then resolving on it's own and has not recurred. Some tingling in her finger tips. No incontinence, saddle anesthesia, falls, or severe HAs. States she has not had any n/v/d, COVID, or a viral illness in the past month. She saw a dermatologist on 02/11/21 originally for acne, but while there, MD looked at rash. She was given a Rx of Doxycycline 130m po bid, but only took one a day due to concern over stomach upset. She has been taking Doxy since 02/11/21, but not as prescribed. Asked patient about being checked for Lyme's or RMSF and she stated her dermatologist was not concerned for either.   She complains of  easily becoming fatigued with any movements and states when she feels hot all over, she is especially fatigued. Her joints have been achy, but no nodules, redness, or heat. Did have some right knee swelling. No generalized macular/papular rash or sore throat. No vision changes. Tmax 99.491for 24 hours.   Workup thus far has showed K+ 3.3, No leukocytosis, CRP elevated at 3.1, ESR 34, HIV NR, glucose 103. [facility] send out test for Coccidioides immitis IgG and IgM are pending. ACNA profile, ANA IFA, CMV IgM, Epstein-Barr virus IgG and IgM, RPR, parovirus B19 antibody, and RSV pending. We asked medicine to consult ID for possible infection and they saw patient today.   AbUVO:ZDGUYQIHKVnd Cefepime x 1 dose. Per Dr. 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  Infection possible or post infectious state vs autoimmune.   Differential includes a post infectious syndrome, viral infection, syphilis, hepatitis B, embolic disease."    Neurology asked to consult for LE weakness.    ROS: A robust ROS was performed and is negative except as noted in the HPI.   PMHx: Unremarkable.   Family History  Problem Relation Age of Onset   Allergic rhinitis Sister    Allergic rhinitis Brother    Angioedema Neg Hx    Asthma Neg Hx    Atopy Neg Hx    Eczema Neg Hx    Urticaria Neg Hx    Immunodeficiency Neg Hx     Social  History:   reports that she has never smoked. She has never used smokeless tobacco. She reports that she does not drink alcohol and does not use drugs.  Medications  Current Facility-Administered Medications:    enoxaparin (LOVENOX) injection 40 mg, 40 mg, Subcutaneous, Q24H, Kyle, Tyrone A, DO, 40 mg at 03/06/21 2125   ibuprofen (ADVIL) tablet 400 mg, 400 mg, Oral, Q6H PRN, Cherylann Ratel A, DO  Exam: Current vital signs: BP 113/77 (BP Location: Left Arm)   Pulse 80   Temp 99.4 F (37.4 C) (Oral)   Resp 18   Ht _0  (1.6 m)   Wt 50.3 kg    LMP 02/02/2021 (Approximate)   SpO2 98%   BMI 19.64 kg/m  Vital signs in last 24 hours: Temp:  [97.6 F (36.4 C)-99.4 F (37.4 C)] 99.4 F (37.4 C) (10/27 0859) Pulse Rate:  [56-95] 80 (10/27 0859) Resp:  [16-27] 18 (10/27 0859) BP: (90-115)/(53-77) 113/77 (10/27 0859) SpO2:  [97 %-100 %] 98 % (10/27 0859) Weight:  [50.3 kg] 50.3 kg (10/26 1612)  PE: GENERAL: Fairly well appearing female. Awake, alert in NAD.  HEENT: normocephalic and atraumatic. LUNGS - Normal respiratory effort.  CV - RRR on tele. ABDOMEN - Soft, nontender. Ext: warm, well perfused. Psych: affect concerned, but calm, cooperative.   NEURO:  Alert and oriented x 4. Follows commands.  Speech/Language: speech is without dysarthria or aphasia.  Naming, repetition, fluency, and comprehension intact.  Cranial Nerves:  II: PERRL  4 mm/brisk. visual fields full.  III, IV, VI: EOMI. Lid elevation symmetric and full.  V: sensation is intact and symmetrical to face. Blinks to threat. VII: Smile is symmetrical.  VIII:hearing intact to voice. IX, X: palate elevation is symmetric. Phonation normal.  XI: normal sternocleidomastoid and trapezius muscle strength. XTK:WIOXBD is symmetrical without fasciculations.   Motor:  Strength throughout is 5/5. Her strength exam is normal in her LLE but is unable to lift the leg off the bed.  However, she did lift her LLE for MD exam with encouragement.  Tone is normal. Bulk is normal.  Sensation- Intact to light touch bilaterally in all four extremities. Coordination: FTN intact bilaterally.  DTRs:  RUE:  biceps 2     brachioradialis 2    RLE:  patella  3    LUE:  biceps  2   brachioradialis 2     LLE: patella  3     Gait- deferred.  +++When NP was examining her UE for sensation and strength, she began to whisper and become quiet (like passing out), turned her head to the right, and closed her eyes. When stimulated, she opened her eyes. When asked what happened, she stated that  the exam was making her hot and when she gets hot, she just has no strength. After this, patient remained alert for finished exam.   Labs I have reviewed labs in epic and the results pertinent to this consultation are: HIV NR.  K 3.3.  CRP 3.1 (was 2.5). CK 55.   CBC    Component Value Date/Time   WBC 9.4 03/07/2021 0421   RBC 3.81 (L) 03/07/2021 0421   HGB 11.3 (L) 03/07/2021 0421   HCT 34.4 (L) 03/07/2021 0421   PLT 280 03/07/2021 0421   MCV 90.3 03/07/2021 0421   MCH 29.7 03/07/2021 0421   MCHC 32.8 03/07/2021 0421   RDW 12.3 03/07/2021 0421   LYMPHSABS 0.6 (L) 03/06/2021 0739   MONOABS 0.1 03/06/2021 0739   EOSABS  0.0 03/06/2021 0739   BASOSABS 0.0 03/06/2021 0739    CMP     Component Value Date/Time   NA 139 03/07/2021 0421   K 3.3 (L) 03/07/2021 0421   CL 107 03/07/2021 0421   CO2 27 03/07/2021 0421   GLUCOSE 103 (H) 03/07/2021 0421   BUN 13 03/07/2021 0421   CREATININE 0.56 03/07/2021 0421   CALCIUM 8.7 (L) 03/07/2021 0421   PROT 6.7 03/07/2021 0421   ALBUMIN 3.4 (L) 03/07/2021 0421   AST 15 03/07/2021 0421   ALT 9 03/07/2021 0421   ALKPHOS 38 03/07/2021 0421   BILITOT 0.8 03/07/2021 0421   GFRNONAA >60 03/07/2021 0421   GFRAA >60 09/09/2017 1030    Imaging MD reviewed the images obtained.  Assessment: 30 yo with no PMHx admitted due to c/o inability to walk. She reports lesions on her thighs, legs, and feet, which eventually dried up. Saw dermatology and was put on Doxycycline but did not take it as prescribed. She has had descending weakness and polyarthralgias/polymyositis, fever, chills for past month or so, worsening. NP is not sure what to make of some portions of her exam (the head turning with "heat" and great fatigue) and her seemingly lack of effort on exam. Her neurological exam is benign for the etiology of her weakness. MD did get her to raise her LLE.   Recommendation:  -await and f/up labs from medicine and ID for infection vs autoimmune  etiology.  -Appreciate ID consult.   Pt seen by Clance Boll, NP/Neuro and later by MD. Note/plan to be edited by MD as needed.  Pager: [phone]   62F with no significant hx p/w itchy rash x 30 days now encrusted with intermittent fever, arthralgias, myalgias, lethargy and chills. Was taking doxycycline once daily instead of prescribed BID by dermatologist. She is hesitant on my exam and it appears that she could be weak but on systemically testing all muscle groups individually, her strength is intact and I do not see any neuro deficit. Her reflexes are intact and sensation is intact to light touch and pinprick. She has neck pain but no meningismus, she has hyperesthesia. She has pain on join palpation, specially in her shoulder and feels crackles in her joints.  I suspect that the underlying process is multisystem and concern is for potential infectious vs autoimmune. I think it is reasonable to evaluate for syphilis, myositis with CK and aldolase, potential endocarditis althou does not have osler nodes of janeway lesions. Her rash is inconsistent with lyme. Could potentially be a delayed hypersensitivity reaction to bedbug bites, she travels a lot. ID team has ordered extensive workup and we will follow along. If the workup is non revealing, we can consider a potential LP at that time but given the invasive nature and likely low yield, I would hold off on LP for now. I think it is reasonable to try steroids to see if that helps.  Erin Pager Number [phone].

## 2021-03-07 NOTE — Consult Note (Addendum)
[facility]       Reason for Consult: fever, rash, polyarthralgias    Referring Physician: Dr. Elvera Lennox  Active Problems:   Weakness    enoxaparin (LOVENOX) injection  40 mg Subcutaneous Q24H    Recommendations: Steroids Check other serologies - viral hepatitis, RPR, coccidio.  Echo for ? 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  Infection possible or post infectious state vs autoimmune.   Differential includes a post infectious syndrome, viral infection, syphilis, hepatitis B, embolic disease.   Antibiotics: Vancomycin and cefepime x 1 dose  HPI: Rebecca Hughes is a 30 y.o. female with no significant past medical history who noted multiple bug bite-like rashes following a hotel stay out of town.  She was in [place] then [place] and her symptoms progressed to neck stiffness bilateral then progressed to her shoulder and right arm, to her knees, initially the left knee and then right knee with swelling.  No recent illness, no diarrhea.  No significant headache, no vision changes.  No sick contacts.  No new medications, no herbal medications.  Hospitalized due to difficulty walking.     Review of Systems:  Constitutional: positive for fevers, chills, and fatigue Respiratory: negative for cough, sputum, or hemoptysis Gastrointestinal: negative for nausea and diarrhea All other systems reviewed and are negative    PMH: no medical issues  Social History   Tobacco Use   Smoking status: Never   Smokeless tobacco: Never  Vaping Use   Vaping Use: Never used  Substance Use Topics   Alcohol use: Never   Drug use: Never    Family History  Problem Relation Age of Onset   Allergic rhinitis Sister    Allergic rhinitis Brother    Angioedema Neg Hx    Asthma Neg Hx    Atopy Neg Hx    Eczema Neg Hx    Urticaria Neg Hx    Immunodeficiency Neg Hx   No rheumatic  diseases, no SLE  No Known Allergies  Physical Exam: Constitutional: in no apparent distress and non-toxic  Vitals:   03/07/21 0859 03/07/21 1326  BP: 113/77 112/65  Pulse: 80 68  Resp: 18 18  Temp: 99.4 F (37.4 C) 97.8 F (36.6 C)  SpO2: 98% 98%   EYES: anicteric ENMT: no thrush; no cervical lad LAD: no supraclavicular lad Cardiovascular: Cor RRR Respiratory: clear; GI: Bowel sounds are normal, liver is not enlarged, spleen is not enlarged Musculoskeletal: right knee with mild effusion, mild warmth Skin: multiple small bite-like lesions.  Left foot with dry ecchymotic area, circular.    Lab Results  Component Value Date   WBC 9.4 03/07/2021   HGB 11.3 (L) 03/07/2021   HCT 34.4 (L) 03/07/2021   MCV 90.3 03/07/2021   PLT 280 03/07/2021    Lab Results  Component Value Date   CREATININE 0.56 03/07/2021   BUN 13 03/07/2021   NA 139 03/07/2021   K 3.3 (L) 03/07/2021   CL 107 03/07/2021   CO2 27 03/07/2021    Lab Results  Component Value Date   ALT 9 03/07/2021   AST 15 03/07/2021   ALKPHOS 38 03/07/2021     Microbiology: Recent Results (from the past 240 hour(s))  Resp Panel by RT-PCR (Flu A&B, Covid) Nasopharyngeal Swab     Status: None   Collection Time: 03/05/21  2:14 PM   Specimen: Nasopharyngeal Swab; Nasopharyngeal(NP) swabs in vial transport medium  Result Value Ref Range Status   SARS Coronavirus 2 by RT PCR NEGATIVE NEGATIVE Final    Comment: (NOTE) SARS-CoV-2 target nucleic acids are NOT DETECTED.  The SARS-CoV-2 RNA is generally detectable in upper respiratory specimens during the acute phase of infection. The lowest concentration of SARS-CoV-2 viral copies this assay can detect is 138 copies/mL. A negative result does not preclude SARS-Cov-2 infection and should not be used as the sole basis for treatment or other patient management decisions. A negative result may occur with  improper specimen collection/handling, submission of specimen  other than nasopharyngeal swab, presence of viral mutation(s) within the areas targeted by this assay, and inadequate number of viral copies(<138 copies/mL). A negative result must be combined with clinical observations, patient history, and epidemiological information. The expected result is Negative.  Fact Sheet for Patients:  BloggerCourse.com  Fact Sheet for Healthcare Providers:  SeriousBroker.it  This test is no t yet approved or cleared by the [place] FDA and  has been authorized for detection and/or diagnosis of SARS-CoV-2 by FDA under an Emergency Use Authorization (EUA). This EUA will remain  in effect (meaning this test can be used) for the duration of the COVID-19 declaration under Section 564(b)(1) of the Act, 21 U.S.C.section 360bbb-3(b)(1), unless the authorization is terminated  or revoked sooner.       Influenza A by PCR NEGATIVE NEGATIVE Final   Influenza B by PCR NEGATIVE NEGATIVE Final    Comment: (NOTE) The Xpert Xpress SARS-CoV-2/FLU/RSV plus assay is intended as an aid in the diagnosis of influenza from Nasopharyngeal swab specimens and should not be used as a sole basis for treatment. Nasal washings and aspirates are unacceptable for Xpert Xpress SARS-CoV-2/FLU/RSV testing.  Fact Sheet for Patients: BloggerCourse.com  Fact Sheet for Healthcare Providers: SeriousBroker.it  This test is not yet approved or cleared by the [place] FDA and has been authorized for detection and/or diagnosis of SARS-CoV-2 by FDA under an Emergency Use Authorization (EUA). This EUA will remain in effect (meaning this test can be used) for the duration of the COVID-19 declaration under Section 564(b)(1) of the Act, 21 U.S.C. section 360bbb-3(b)(1), unless the authorization is terminated or revoked.  Performed at [facility], [address], [place], [place] [address]   Blood culture (routine x 2)     Status: None (Preliminary result)   Collection Time: 03/05/21  2:20 PM   Specimen: BLOOD RIGHT ARM  Result Value Ref Range Status   Specimen Description   Final    BLOOD RIGHT ARM Performed at [facility], [address] [place], [place] [address]    Special Requests   Final    BOTTLES DRAWN AEROBIC AND ANAEROBIC Blood Culture adequate volume Performed at [facility], [address], [place], [place] [address]    Culture   Final    NO GROWTH 2 DAYS Performed at [facility], [address] [place], [place] [address]    Report Status PENDING  Incomplete  Blood culture (routine x 2)     Status: None (Preliminary result)   Collection Time: 03/05/21  2:40 PM   Specimen: BLOOD  Result Value Ref Range Status   Specimen Description BLOOD LEFT ANTECUBITAL  Final   Special Requests   Final    BOTTLES DRAWN AEROBIC AND ANAEROBIC Blood Culture adequate volume   Culture   Final    NO GROWTH 2 DAYS Performed at [facility], [address], [place],  [place] [address]    Report Status PENDING  Incomplete    Gardiner Barefoot, MD [facility] [email or web address] 03/07/2021, 2:09 PM

## 2021-03-07 NOTE — Progress Notes (Signed)
PROGRESS NOTE  Rebecca Hughes TKW:[id number] DOB: 1990/07/08 DOA: 03/05/2021 PCP: Pcp, No   LOS: 0 days   Brief Narrative / Interim history: 30 year old female with no significant past medical history comes into the hospital with fevers, chills, arthralgias as well as lower extremity weakness.  About a month ago, while traveling for business, has noticed a rash mainly located on her legs, initially looking like bug bites but then progressed to form vesicles with a clear liquid, eventually popped and started to scab over.  So few lesions on her back, thighs, arms but mostly were located on the legs.  She is also been complaining of stiffness in her shoulders, neck, having difficulties rotating her neck.  Has been having swelling in her knees, pain, as well as progressive weakness to the point that she was unable to walk and came to the hospital.  Subjective / 24h Interval events: Feels about the same this morning, a little bit better that she is in the hospital overall but still is unable to walk and continues to complain of ongoing lower extremity weakness  Assessment & Plan: Principal Problem Generalized lower extremity weakness, polyarthralgia, petechial rash-unclear etiology at this point, possibly had a viral illness and now has reactive arthritis. ID consulted for input.  Concerning his lower extremity weakness, have consulted neurology as well, appreciate expertise -HIV negative.  CMV IgM in process, RSV, EBV in process.  Immune work-up with ANA and ANCA profile has been sent -CRP, sed rate mildly elevated.  Scheduled Meds:  enoxaparin (LOVENOX) injection  40 mg Subcutaneous Q24H   potassium chloride  40 mEq Oral Once   Continuous Infusions: PRN Meds:.ibuprofen  Diet Orders (From admission, onward)     Start     Ordered   03/06/21 1828  Diet regular Room service appropriate? Yes; Fluid consistency: Thin  Diet effective now       Question Answer Comment  Room service appropriate?  Yes   Fluid consistency: Thin      03/06/21 1827            DVT prophylaxis: enoxaparin (LOVENOX) injection 40 mg Start: 03/06/21 2200     Code Status: Full Code  Family Communication: No family at bedside  Status is: Observation  The patient will require care spanning > 2 midnights and should be moved to inpatient because: Inability to walk, persistent weakness  Level of care: Med-Surg  Consultants:  Neurology ID  Procedures:  none  Microbiology  none  Antimicrobials: none    Objective: Vitals:   03/06/21 2055 03/07/21 0121 03/07/21 0513 03/07/21 0859  BP: (!) 90/53 95/61 (!) 97/58 113/77  Pulse: 71 (!) 56 63 80  Resp: 16 16 16 18   Temp: 97.6 F (36.4 C) 98.5 F (36.9 C) 98.3 F (36.8 C) 99.4 F (37.4 C)  TempSrc: Oral Oral Oral Oral  SpO2: 97% 100% 100% 98%  Weight:      Height:        Intake/Output Summary (Last 24 hours) at 03/07/2021 1006 Last data filed at 03/07/2021 0600 Gross per 24 hour  Intake 480 ml  Output 1950 ml  Net -1470 ml   Filed Weights   03/05/21 1028 03/06/21 1612  Weight: 50.3 kg 50.3 kg    Examination:  Constitutional: NAD Eyes: no scleral icterus ENMT: Mucous membranes are moist.  Neck: normal, supple Respiratory: clear to auscultation bilaterally, no wheezing, no crackles. Cardiovascular: Regular rate and rhythm, no murmurs / rubs / gallops. No LE edema. Good peripheral  pulses Abdomen: non distended, no tenderness. Bowel sounds positive.  Musculoskeletal: no clubbing / cyanosis.  Good range of motion at the knee, shoulders.  Mild bilateral knee swelling. Skin: Faint rash on the lower extremities, healing small subcentimeter lesions Neurologic: Cranial nerves grossly intact.  Upper extremity strength 5/5.  Lower extremity 4/5, equal  Data Reviewed: I have independently reviewed following labs and imaging studies  CBC: Recent Labs  Lab 03/05/21 1052 03/06/21 0739 03/07/21 0421  WBC 7.2 7.2 9.4  NEUTROABS   --  6.6  --   HGB 12.8 12.1 11.3*  HCT 38.6 35.8* 34.4*  MCV 88.5 88.4 90.3  PLT 291 310 280   Basic Metabolic Panel: Recent Labs  Lab 03/05/21 1052 03/06/21 0739 03/07/21 0421  NA 136 139 139  K 3.6 3.8 3.3*  CL 102 106 107  CO2 25 22 27   GLUCOSE 92 109* 103*  BUN 6 7 13   CREATININE 0.49 0.50 0.56  CALCIUM 9.3 8.4* 8.7*   Liver Function Tests: Recent Labs  Lab 03/05/21 1427 03/06/21 0739 03/07/21 0421  AST 16 13* 15  ALT 7 6 9   ALKPHOS 52 42 38  BILITOT 1.0 0.6 0.8  PROT 8.6* 7.1 6.7  ALBUMIN 4.4 3.7 3.4*   Coagulation Profile: No results for input(s): INR, PROTIME in the last 168 hours. HbA1C: No results for input(s): HGBA1C in the last 72 hours. CBG: No results for input(s): GLUCAP in the last 168 hours.  Recent Results (from the past 240 hour(s))  Resp Panel by RT-PCR (Flu A&B, Covid) Nasopharyngeal Swab     Status: None   Collection Time: 03/05/21  2:14 PM   Specimen: Nasopharyngeal Swab; Nasopharyngeal(NP) swabs in vial transport medium  Result Value Ref Range Status   SARS Coronavirus 2 by RT PCR NEGATIVE NEGATIVE Final    Comment: (NOTE) SARS-CoV-2 target nucleic acids are NOT DETECTED.  The SARS-CoV-2 RNA is generally detectable in upper respiratory specimens during the acute phase of infection. The lowest concentration of SARS-CoV-2 viral copies this assay can detect is 138 copies/mL. A negative result does not preclude SARS-Cov-2 infection and should not be used as the sole basis for treatment or other patient management decisions. A negative result may occur with  improper specimen collection/handling, submission of specimen other than nasopharyngeal swab, presence of viral mutation(s) within the areas targeted by this assay, and inadequate number of viral copies(<138 copies/mL). A negative result must be combined with clinical observations, patient history, and epidemiological information. The expected result is Negative.  Fact Sheet for  Patients:  03/09/21  Fact Sheet for Healthcare Providers:   This test is no t yet approved or cleared by the 03/07/21 FDA and  has been authorized for detection and/or diagnosis of SARS-CoV-2 by FDA under an Emergency Use Authorization (EUA). This EUA will remain  in effect (meaning this test can be used) for the duration of the COVID-19 declaration under Section 564(b)(1) of the Act, 21 U.S.C.section 360bbb-3(b)(1), unless the authorization is terminated  or revoked sooner.       Influenza A by PCR NEGATIVE NEGATIVE Final   Influenza B by PCR NEGATIVE NEGATIVE Final    Comment: (NOTE) The Xpert Xpress SARS-CoV-2/FLU/RSV plus assay is intended as an aid in the diagnosis of influenza from Nasopharyngeal swab specimens and should not be used as a sole basis for treatment. Nasal washings and aspirates are unacceptable for Xpert Xpress SARS-CoV-2/FLU/RSV testing.  Fact Sheet for Patients: BloggerCourse.com  Fact Sheet for Healthcare  Providers: SeriousBroker.it  This test is not yet approved or cleared by the [place] and has been authorized for detection and/or diagnosis of SARS-CoV-2 by FDA under an Emergency Use Authorization (EUA). This EUA will remain in effect (meaning this test can be used) for the duration of the COVID-19 declaration under Section 564(b)(1) of the Act, 21 U.S.C. section 360bbb-3(b)(1), unless the authorization is terminated or revoked.  Performed at [facility], [address], [place], [place] [address]   Blood culture (routine x 2)     Status: None (Preliminary result)   Collection Time: 03/05/21  2:20 PM   Specimen: BLOOD RIGHT ARM  Result Value Ref Range Status   Specimen Description   Final    BLOOD RIGHT ARM Performed at [facility], [address], [place],  [place] [address]    Special Requests   Final    BOTTLES DRAWN AEROBIC AND ANAEROBIC Blood Culture adequate volume Performed at [facility], [address], [place], [place] [address]    Culture   Final    NO GROWTH 2 DAYS Performed at [facility], [address] [place], [place] [address]    Report Status PENDING  Incomplete  Blood culture (routine x 2)     Status: None (Preliminary result)   Collection Time: 03/05/21  2:40 PM   Specimen: BLOOD  Result Value Ref Range Status   Specimen Description BLOOD LEFT ANTECUBITAL  Final   Special Requests   Final    BOTTLES DRAWN AEROBIC AND ANAEROBIC Blood Culture adequate volume   Culture   Final    NO GROWTH 2 DAYS Performed at [facility], [address], [place], [place] [address]    Report Status PENDING  Incomplete     Radiology Studies: No results found.  Time spent: 35 minutes, more than 50% at bedside  Pamella Pert, MD, PhD [facility]  Between 7 am - 7 pm I am available, please contact me via Amion (for emergencies) or Securechat (non urgent messages)  Between 7 pm - 7 am I am not available, please contact night coverage MD/APP via Amion

## 2021-03-07 NOTE — Progress Notes (Signed)
  Echocardiogram 2D Echocardiogram has been performed.  Augustine Radar 03/07/2021, 3:41 PM

## 2021-03-08 ENCOUNTER — Encounter (HOSPITAL_COMMUNITY): Payer: Self-pay | Admitting: Internal Medicine

## 2021-03-08 DIAGNOSIS — R509 Fever, unspecified: Secondary | ICD-10-CM | POA: Diagnosis not present

## 2021-03-08 DIAGNOSIS — M791 Myalgia, unspecified site: Secondary | ICD-10-CM | POA: Diagnosis not present

## 2021-03-08 DIAGNOSIS — R5383 Other fatigue: Secondary | ICD-10-CM | POA: Diagnosis not present

## 2021-03-08 DIAGNOSIS — R29898 Other symptoms and signs involving the musculoskeletal system: Secondary | ICD-10-CM | POA: Diagnosis not present

## 2021-03-08 LAB — HEPATITIS PANEL, ACUTE
HCV Ab: NONREACTIVE
Hep A IgM: NONREACTIVE
Hep B C IgM: NONREACTIVE
Hepatitis B Surface Ag: NONREACTIVE

## 2021-03-08 LAB — RAPID URINE DRUG SCREEN, HOSP PERFORMED
Amphetamines: NOT DETECTED
Barbiturates: NOT DETECTED
Benzodiazepines: NOT DETECTED
Cocaine: NOT DETECTED
Opiates: NOT DETECTED
Tetrahydrocannabinol: NOT DETECTED

## 2021-03-08 LAB — ANTINUCLEAR ANTIBODIES, IFA: ANA Ab, IFA: NEGATIVE

## 2021-03-08 LAB — EPSTEIN-BARR VIRUS VCA, IGM: EBV VCA IgM: 107 U/mL — ABNORMAL HIGH (ref 0.0–35.9)

## 2021-03-08 LAB — CMV IGM: CMV IgM: <30 AU/mL (ref 0.0–29.9)

## 2021-03-08 LAB — RPR: RPR Ser Ql: NONREACTIVE

## 2021-03-08 LAB — EPSTEIN-BARR VIRUS VCA, IGG: EBV VCA IgG: 202 U/mL — ABNORMAL HIGH (ref 0.0–17.9)

## 2021-03-08 MED ORDER — CYANOCOBALAMIN 1000 MCG/ML IJ SOLN
1000.0000 ug | Freq: Every day | INTRAMUSCULAR | Status: AC
  Administered 2021-03-08 – 2021-03-12 (×5): 1000 ug via INTRAMUSCULAR
  Filled 2021-03-08 (×5): qty 1

## 2021-03-08 NOTE — Progress Notes (Signed)
[facility]   Reason for visit: Follow up on polyarthralgias  Interval History: somewhat better, though still with difficulty walking. No fever.      Physical Exam: Constitutional:  Vitals:   03/08/21 0542 03/08/21 1314  BP: 108/65 102/63  Pulse: 67 70  Resp: 16 16  Temp: 97.6 F (36.4 C) 98 F (36.7 C)  SpO2: 100% 97%   patient appears in NAD Respiratory: Normal respiratory effort; CTA B Cardiovascular: RRR GI: soft, nt, nd Skin: rashes dry, resolving  Review of Systems: Constitutional: negative for fevers and chills Gastrointestinal: negative for nausea and diarrhea Neurological: negative for headaches and no vision changes  Lab Results  Component Value Date   WBC 9.4 03/07/2021   HGB 11.3 (L) 03/07/2021   HCT 34.4 (L) 03/07/2021   MCV 90.3 03/07/2021   PLT 280 03/07/2021    Lab Results  Component Value Date   CREATININE 0.56 03/07/2021   BUN 13 03/07/2021   NA 139 03/07/2021   K 3.3 (L) 03/07/2021   CL 107 03/07/2021   CO2 27 03/07/2021    Lab Results  Component Value Date   ALT 9 03/07/2021   AST 15 03/07/2021   ALKPHOS 38 03/07/2021     Microbiology: Recent Results (from the past 240 hour(s))  Resp Panel by RT-PCR (Flu A&B, Covid) Nasopharyngeal Swab     Status: None   Collection Time: 03/05/21  2:14 PM   Specimen: Nasopharyngeal Swab; Nasopharyngeal(NP) swabs in vial transport medium  Result Value Ref Range Status   SARS Coronavirus 2 by RT PCR NEGATIVE NEGATIVE Final    Comment: (NOTE) SARS-CoV-2 target nucleic acids are NOT DETECTED.  The SARS-CoV-2 RNA is generally detectable in upper respiratory specimens during the acute phase of infection. The lowest concentration of SARS-CoV-2 viral copies this assay can detect is 138 copies/mL. A negative result does not preclude SARS-Cov-2 infection and should not be used as the sole basis for treatment or other patient management decisions. A negative result may occur  with  improper specimen collection/handling, submission of specimen other than nasopharyngeal swab, presence of viral mutation(s) within the areas targeted by this assay, and inadequate number of viral copies(<138 copies/mL). A negative result must be combined with clinical observations, patient history, and epidemiological information. The expected result is Negative.  Fact Sheet for Patients:  BloggerCourse.com  Fact Sheet for Healthcare Providers:  SeriousBroker.it  This test is no t yet approved or cleared by the [place] FDA and  has been authorized for detection and/or diagnosis of SARS-CoV-2 by FDA under an Emergency Use Authorization (EUA). This EUA will remain  in effect (meaning this test can be used) for the duration of the COVID-19 declaration under Section 564(b)(1) of the Act, 21 U.S.C.section 360bbb-3(b)(1), unless the authorization is terminated  or revoked sooner.       Influenza A by PCR NEGATIVE NEGATIVE Final   Influenza B by PCR NEGATIVE NEGATIVE Final    Comment: (NOTE) The Xpert Xpress SARS-CoV-2/FLU/RSV plus assay is intended as an aid in the diagnosis of influenza from Nasopharyngeal swab specimens and should not be used as a sole basis for treatment. Nasal washings and aspirates are unacceptable for Xpert Xpress SARS-CoV-2/FLU/RSV testing.  Fact Sheet for Patients: BloggerCourse.com  Fact Sheet for Healthcare Providers: SeriousBroker.it  This test is not yet approved or cleared by the [place] FDA and has been authorized for detection and/or diagnosis of SARS-CoV-2 by FDA under an Emergency Use Authorization (  EUA). This EUA will remain in effect (meaning this test can be used) for the duration of the COVID-19 declaration under Section 564(b)(1) of the Act, 21 U.S.C. section 360bbb-3(b)(1), unless the authorization is terminated  or revoked.  Performed at [facility], [address], [place], [place] [address]   Blood culture (routine x 2)     Status: None (Preliminary result)   Collection Time: 03/05/21  2:20 PM   Specimen: BLOOD RIGHT ARM  Result Value Ref Range Status   Specimen Description   Final    BLOOD RIGHT ARM Performed at [facility], [address], [place], [place] [address]    Special Requests   Final    BOTTLES DRAWN AEROBIC AND ANAEROBIC Blood Culture adequate volume Performed at [facility], [address] [place], [place] [address]    Culture   Final    NO GROWTH 3 DAYS Performed at [facility], [address], [place], [place] [address]    Report Status PENDING  Incomplete  Blood culture (routine x 2)     Status: None (Preliminary result)   Collection Time: 03/05/21  2:40 PM   Specimen: BLOOD  Result Value Ref Range Status   Specimen Description BLOOD LEFT ANTECUBITAL  Final   Special Requests   Final    BOTTLES DRAWN AEROBIC AND ANAEROBIC Blood Culture adequate volume   Culture   Final    NO GROWTH 3 DAYS Performed at [facility], [address], [place], [place] [address]    Report Status PENDING  Incomplete    Impression/Plan:  1. Polyarthritis/myalgias - started steroids yesterday and able to get to the bathroom with the walker.  Maybe some improvement.  I would anticipate some improvement in the next 2-3 days if the steroids are effective. Would then need a prolonged taper.  No positive results to date.   Continue with steroids  No current indication for antibiotics  2.  Fever - here she has remained afebrile and will continue to monitor.   Dr. Daiva Eves will follow the labs over the weekend and I will follow up again on Monday.

## 2021-03-08 NOTE — Progress Notes (Signed)
PROGRESS NOTE  Rebecca Hughes ZOX:[id number] DOB: 08/18/1990 DOA: 03/05/2021 PCP: Pcp, No   LOS: 1 day   Brief Narrative / Interim history: 30 year old female with no significant past medical history comes into the hospital with fevers, chills, arthralgias as well as lower extremity weakness.  About a month ago, while traveling for business, has noticed a rash mainly located on her legs, initially looking like bug bites but then progressed to form vesicles with a clear liquid, eventually popped and started to scab over.  So few lesions on her back, thighs, arms but mostly were located on the legs.  She is also been complaining of stiffness in her shoulders, neck, having difficulties rotating her neck.  Has been having swelling in her knees, pain, as well as progressive weakness to the point that she was unable to walk and came to the hospital.  Subjective / 24h Interval events: Overall a lot better, feels like upper body strength is better.  Reports bilateral shoulder sharp pains that come and go.  Still has difficulties walking, can use a walker barely gets to the bathroom and back while shuffling her feet.  States that she has difficulties fully lifting her feet off the ground  Assessment & Plan: Principal Problem Generalized lower extremity weakness, polyarthralgia, rash-unclear etiology at this point, possibly had a viral illness and now has reactive arthritis.  ID and neurology consulted. -HIV negative.  CMV IgM negative, RSV, EBV in process.  Immune work-up with ANA and ANCA profile has been sent -CRP, sed rate mildly elevated.  ANA pending, ANCA negative. -Coccidioides Immitis IgM and IgG pending -Histoplasma antigen pending, RPR pending, Parvovirus B19 pending -TSH normal, vitamin B12 normal but on the low side, supplement -Obtain PT evaluated as well -Continue steroids  Scheduled Meds:  cyanocobalamin  1,000 mcg Intramuscular Daily   enoxaparin (LOVENOX) injection  40 mg Subcutaneous  Q24H   methylPREDNISolone (SOLU-MEDROL) injection  40 mg Intravenous Q12H   Continuous Infusions: PRN Meds:.ibuprofen  Diet Orders (From admission, onward)     Start     Ordered   03/06/21 1828  Diet regular Room service appropriate? Yes; Fluid consistency: Thin  Diet effective now       Question Answer Comment  Room service appropriate? Yes   Fluid consistency: Thin      03/06/21 1827            DVT prophylaxis: enoxaparin (LOVENOX) injection 40 mg Start: 03/06/21 2200     Code Status: Full Code  Family Communication: No family at bedside  Status is: Inpatient  The patient will require care spanning > 2 midnights and should be moved to inpatient because: Inability to walk, persistent weakness  Level of care: Med-Surg  Consultants:  Neurology ID  Procedures:  none  Microbiology  none  Antimicrobials: none    Objective: Vitals:   03/07/21 0859 03/07/21 1326 03/07/21 2047 03/08/21 0542  BP: 113/77 112/65 103/61 108/65  Pulse: 80 68 66 67  Resp: Temp: 99.4 F (37.4 C) 97.8 F (36.6 C) 98.5 F (36.9 C) 97.6 F (36.4 C)  TempSrc: Oral Oral Oral Oral  SpO2: 98% 98% 98% 100%  Weight:      Height:        Intake/Output Summary (Last 24 hours) at 03/08/2021 1003 Last data filed at 03/08/2021 0600 Gross per 24 hour  Intake 1200 ml  Output --  Net 1200 ml    Filed Weights   03/05/21 1028 03/06/21 1612  Weight: 50.3 kg 50.3 kg    Examination:  Constitutional: She is in no distress, in bed Eyes: Anicteric ENMT: Moist mucous membranes Neck: normal, supple Respiratory: CTA bilaterally, no wheezing, no crackles Cardiovascular: Regular rate and rhythm, no murmurs, no peripheral edema Abdomen: No distention Musculoskeletal: no clubbing / cyanosis.  Skin: Faint rash on the lower extremities, healing small subcentimeter lesions, no new rashes Neurologic: Weak on the lower extremities but symmetric.  Data Reviewed: I have  independently reviewed following labs and imaging studies  CBC: Recent Labs  Lab 03/05/21 1052 03/06/21 0739 03/07/21 0421  WBC 7.2 7.2 9.4  NEUTROABS  --  6.6  --   HGB 12.8 12.1 11.3*  HCT 38.6 35.8* 34.4*  MCV 88.5 88.4 90.3  PLT 291 310 280    Basic Metabolic Panel: Recent Labs  Lab 03/05/21 1052 03/06/21 0739 03/07/21 0421  NA 136 139 139  K 3.6 3.8 3.3*  CL 102 106 107  CO2 25 22 27   GLUCOSE 92 109* 103*  BUN 6 7 13   CREATININE 0.49 0.50 0.56  CALCIUM 9.3 8.4* 8.7*    Liver Function Tests: Recent Labs  Lab 03/05/21 1427 03/06/21 0739 03/07/21 0421  AST 16 13* 15  ALT 7 6 9   ALKPHOS 52 42 38  BILITOT 1.0 0.6 0.8  PROT 8.6* 7.1 6.7  ALBUMIN 4.4 3.7 3.4*    Coagulation Profile: No results for input(s): INR, PROTIME in the last 168 hours. HbA1C: No results for input(s): HGBA1C in the last 72 hours. CBG: No results for input(s): GLUCAP in the last 168 hours.  Recent Results (from the past 240 hour(s))  Resp Panel by RT-PCR (Flu A&B, Covid) Nasopharyngeal Swab     Status: None   Collection Time: 03/05/21  2:14 PM   Specimen: Nasopharyngeal Swab; Nasopharyngeal(NP) swabs in vial transport medium  Result Value Ref Range Status   SARS Coronavirus 2 by RT PCR NEGATIVE NEGATIVE Final    Comment: (NOTE) SARS-CoV-2 target nucleic acids are NOT DETECTED.  The SARS-CoV-2 RNA is generally detectable in upper respiratory specimens during the acute phase of infection. The lowest concentration of SARS-CoV-2 viral copies this assay can detect is 138 copies/mL. A negative result does not preclude SARS-Cov-2 infection and should not be used as the sole basis for treatment or other patient management decisions. A negative result may occur with  improper specimen collection/handling, submission of specimen other than nasopharyngeal swab, presence of viral mutation(s) within the areas targeted by this assay, and inadequate number of viral copies(<138 copies/mL). A  negative result must be combined with clinical observations, patient history, and epidemiological information. The expected result is Negative.  Fact Sheet for Patients:  03/09/21  Fact Sheet for Healthcare Providers:   This test is no t yet approved or cleared by the 03/07/21 FDA and  has been authorized for detection and/or diagnosis of SARS-CoV-2 by FDA under an Emergency Use Authorization (EUA). This EUA will remain  in effect (meaning this test can be used) for the duration of the COVID-19 declaration under Section 564(b)(1) of the Act, 21 U.S.C.section 360bbb-3(b)(1), unless the authorization is terminated  or revoked sooner.       Influenza A by PCR NEGATIVE NEGATIVE Final   Influenza B by PCR NEGATIVE NEGATIVE Final    Comment: (NOTE) The Xpert Xpress SARS-CoV-2/FLU/RSV plus assay is intended as an aid in the diagnosis of influenza from Nasopharyngeal swab specimens and should not be used as a sole basis for  treatment. Nasal washings and aspirates are unacceptable for Xpert Xpress SARS-CoV-2/FLU/RSV testing.  Fact Sheet for Patients: BloggerCourse.com  Fact Sheet for Healthcare Providers: SeriousBroker.it  This test is not yet approved or cleared by the [place] FDA and has been authorized for detection and/or diagnosis of SARS-CoV-2 by FDA under an Emergency Use Authorization (EUA). This EUA will remain in effect (meaning this test can be used) for the duration of the COVID-19 declaration under Section 564(b)(1) of the Act, 21 U.S.C. section 360bbb-3(b)(1), unless the authorization is terminated or revoked.  Performed at [facility], [address] [place], [place] [address]   Blood culture (routine x 2)     Status: None (Preliminary result)   Collection Time: 03/05/21  2:20 PM   Specimen: BLOOD RIGHT ARM   Result Value Ref Range Status   Specimen Description   Final    BLOOD RIGHT ARM Performed at [facility], [address], [place], [place] [address]    Special Requests   Final    BOTTLES DRAWN AEROBIC AND ANAEROBIC Blood Culture adequate volume Performed at [facility], [address] [place], [place] [address]    Culture   Final    NO GROWTH 2 DAYS Performed at [facility], [address] [place], [place] [address]    Report Status PENDING  Incomplete  Blood culture (routine x 2)     Status: None (Preliminary result)   Collection Time: 03/05/21  2:40 PM   Specimen: BLOOD  Result Value Ref Range Status   Specimen Description BLOOD LEFT ANTECUBITAL  Final   Special Requests   Final    BOTTLES DRAWN AEROBIC AND ANAEROBIC Blood Culture adequate volume   Culture   Final    NO GROWTH 2 DAYS Performed at [facility], [address], [place], [place] [address]    Report Status PENDING  Incomplete      Radiology Studies: ECHOCARDIOGRAM COMPLETE  Result Date: 03/07/2021    ECHOCARDIOGRAM REPORT   Patient Name:   MACRINA LEHNERT Date of Exam: 03/07/2021 Medical Rec #:  [id number]     Height:       63.0 in Accession #:    [id number]    Weight:       110.9 lb Date of Birth:  09-14-1990      BSA:          1.505 m Patient Age:    30 years      BP:           112/65 mmHg Patient Gender: F             HR:           72 bpm. Exam Location:  Inpatient Procedure: 2D Echo, Cardiac Doppler and Color Doppler Indications:     Fever  History:         Patient has no prior history of Echocardiogram examinations.  Sonographer:     Eulah Pont RDCS Referring Phys:  Encarnacion Chu COMER Diagnosing Phys: Armanda Magic MD IMPRESSIONS  1. Left ventricular ejection fraction, by estimation, is 60 to 65%. The left ventricle has normal function. The left ventricle has no regional wall motion abnormalities. Left ventricular diastolic parameters were normal.  2.  Right ventricular systolic function is normal. The right ventricular size is normal. Tricuspid regurgitation signal is inadequate for assessing PA pressure.  3. The mitral valve is normal in structure. Trivial mitral valve regurgitation. No evidence of  mitral stenosis.  4. The aortic valve is normal in structure. Aortic valve regurgitation is not visualized. No aortic stenosis is present.  5. The inferior vena cava is normal in size with greater than 50% respiratory variability, suggesting right atrial pressure of 3 mmHg. Conclusion(s)/Recommendation(s): No evidence of valvular vegetations on this transthoracic echocardiogram. Would recommend a transesophageal echocardiogram to exclude infective endocarditis if clinically indicated. FINDINGS  Left Ventricle: Left ventricular ejection fraction, by estimation, is 60 to 65%. The left ventricle has normal function. The left ventricle has no regional wall motion abnormalities. The left ventricular internal cavity size was normal in size. There is  no left ventricular hypertrophy. Left ventricular diastolic parameters were normal. Normal left ventricular filling pressure. Right Ventricle: The right ventricular size is normal. No increase in right ventricular wall thickness. Right ventricular systolic function is normal. Tricuspid regurgitation signal is inadequate for assessing PA pressure. Left Atrium: Left atrial size was normal in size. Right Atrium: Right atrial size was normal in size. Pericardium: There is no evidence of pericardial effusion. Mitral Valve: The mitral valve is normal in structure. Trivial mitral valve regurgitation. No evidence of mitral valve stenosis. Tricuspid Valve: The tricuspid valve is normal in structure. Tricuspid valve regurgitation is trivial. No evidence of tricuspid stenosis. Aortic Valve: The aortic valve is normal in structure. Aortic valve regurgitation is not visualized. No aortic stenosis is present. Pulmonic Valve: The pulmonic  valve was normal in structure. Pulmonic valve regurgitation is not visualized. No evidence of pulmonic stenosis. Aorta: The aortic root is normal in size and structure. Venous: The inferior vena cava is normal in size with greater than 50% respiratory variability, suggesting right atrial pressure of 3 mmHg. IAS/Shunts: No atrial level shunt detected by color flow Doppler.  LEFT VENTRICLE PLAX 2D LVIDd:         3.90 cm   Diastology LVIDs:         2.70 cm   LV e' medial:    11.00 cm/s LV PW:         0.80 cm   LV E/e' medial:  8.8 LV IVS:        0.70 cm   LV e' lateral:   16.20 cm/s LVOT diam:     2.00 cm   LV E/e' lateral: 5.9 LV SV:         70 LV SV Index:   47 LVOT Area:     3.14 cm  RIGHT VENTRICLE RV S prime:     10.50 cm/s TAPSE (M-mode): 2.2 cm LEFT ATRIUM           Index        RIGHT ATRIUM          Index LA diam:      2.00 cm 1.33 cm/m   RA Area:     7.94 cm LA Vol (A2C): 20.6 ml 13.69 ml/m  RA Volume:   12.50 ml 8.31 ml/m LA Vol (A4C): 19.9 ml 13.22 ml/m  AORTIC VALVE LVOT Vmax:   117.00 cm/s LVOT Vmean:  77.100 cm/s LVOT VTI:    0.224 m  AORTA Ao Root diam: 2.80 cm MITRAL VALVE MV Area (PHT): 3.89 cm    SHUNTS MV Decel Time: 195 msec    Systemic VTI:  0.22 m MV E velocity: 96.30 cm/s  Systemic Diam: 2.00 cm MV A velocity: 55.80 cm/s MV E/A ratio:  1.73 Armanda Magic MD Electronically signed by Armanda Magic MD Signature Date/Time: 03/07/2021/4:01:57 PM    Final (  Updated)      Pamella Pert, MD, PhD [facility]  Between 7 am - 7 pm I am available, please contact me via Amion (for emergencies) or Securechat (non urgent messages)  Between 7 pm - 7 am I am not available, please contact night coverage MD/APP via Amion

## 2021-03-09 DIAGNOSIS — M542 Cervicalgia: Secondary | ICD-10-CM

## 2021-03-09 DIAGNOSIS — R21 Rash and other nonspecific skin eruption: Secondary | ICD-10-CM | POA: Diagnosis not present

## 2021-03-09 DIAGNOSIS — M791 Myalgia, unspecified site: Secondary | ICD-10-CM

## 2021-03-09 DIAGNOSIS — R531 Weakness: Secondary | ICD-10-CM

## 2021-03-09 DIAGNOSIS — R29898 Other symptoms and signs involving the musculoskeletal system: Secondary | ICD-10-CM | POA: Diagnosis not present

## 2021-03-09 DIAGNOSIS — R5383 Other fatigue: Secondary | ICD-10-CM | POA: Diagnosis not present

## 2021-03-09 LAB — BASIC METABOLIC PANEL
Anion gap: 10 (ref 5–15)
BUN: 15 mg/dL (ref 6–20)
CO2: 25 mmol/L (ref 22–32)
Calcium: 9 mg/dL (ref 8.9–10.3)
Chloride: 100 mmol/L (ref 98–111)
Creatinine, Ser: 0.53 mg/dL (ref 0.44–1.00)
GFR, Estimated: >60 mL/min (ref >60)
Glucose, Bld: 135 mg/dL — ABNORMAL HIGH (ref 70–99)
Potassium: 3.9 mmol/L (ref 3.5–5.1)
Sodium: 135 mmol/L (ref 135–145)

## 2021-03-09 LAB — CBC
HCT: 36.5 % (ref 36.0–46.0)
Hemoglobin: 12.2 g/dL (ref 12.0–15.0)
MCH: 29.8 pg (ref 26.0–34.0)
MCHC: 33.4 g/dL (ref 30.0–36.0)
MCV: 89.2 fL (ref 80.0–100.0)
Platelets: 370 10*3/uL (ref 150–400)
RBC: 4.09 MIL/uL (ref 3.87–5.11)
RDW: 12.2 % (ref 11.5–15.5)
WBC: 9.4 10*3/uL (ref 4.0–10.5)
nRBC: 0 % (ref 0.0–0.2)

## 2021-03-09 LAB — FERRITIN: Ferritin: 44 ng/mL (ref 11–307)

## 2021-03-09 LAB — LACTATE DEHYDROGENASE: LDH: 112 U/L (ref 98–192)

## 2021-03-09 LAB — RSV(RESPIRATORY SYNCYTIAL VIRUS) AB, BLOOD: RSV Ab: 1:8 {titer} — ABNORMAL HIGH

## 2021-03-09 MED ORDER — ONDANSETRON HCL 4 MG/2ML IJ SOLN
4.0000 mg | Freq: Four times a day (QID) | INTRAMUSCULAR | Status: DC | PRN
  Administered 2021-03-09: 4 mg via INTRAVENOUS
  Filled 2021-03-09: qty 2

## 2021-03-09 NOTE — Progress Notes (Signed)
PROGRESS NOTE  Rebecca Hughes CHE:[id number] DOB: [birth year] DOA: 03/05/2021 PCP: Pcp, No   LOS: 2 days   Brief Narrative / Interim history: 30 year old female with no significant past medical history comes into the hospital with fevers, chills, arthralgias as well as lower extremity weakness.  About a month ago, while traveling for business, has noticed a rash mainly located on her legs, initially looking like bug bites but then progressed to form vesicles with a clear liquid, eventually popped and started to scab over.  So few lesions on her back, thighs, arms but mostly were located on the legs.  She is also been complaining of stiffness in her shoulders, neck, having difficulties rotating her neck.  Has been having swelling in her knees, pain, as well as progressive weakness to the point that she was unable to walk and came to the hospital.  Subjective / 24h Interval events: She appreciates improvement  Assessment & Plan: Principal Problem Generalized lower extremity weakness, polyarthralgia, rash-unclear etiology at this point, possibly had a viral illness and now has reactive arthritis.  ID and neurology consulted. -HIV negative.  CMV IgM negative, RPR negative, ANA negative, ANCA panel negative, TSH normal -Histoplasma antigen pending, coccidioides Immitis IgM and IgG pending, Parvovirus B19 pending -vitamin B12 normal but on the low side, supplement -Obtain PT evaluated as well -Continue steroids -EBV VCA IgM and IgG elevated, suggesting recent infection, possibly culprit for her presentation and skin rash  Active problems Hypokalemia-repleted  Scheduled Meds:  cyanocobalamin  1,000 mcg Intramuscular Daily   enoxaparin (LOVENOX) injection  40 mg Subcutaneous Q24H   methylPREDNISolone (SOLU-MEDROL) injection  40 mg Intravenous Q12H   Continuous Infusions: PRN Meds:.ibuprofen, ondansetron (ZOFRAN) IV  Diet Orders (From admission, onward)     Start     Ordered   03/06/21 1828   Diet regular Room service appropriate? Yes; Fluid consistency: Thin  Diet effective now       Question Answer Comment  Room service appropriate? Yes   Fluid consistency: Thin      03/06/21 1827            DVT prophylaxis: enoxaparin (LOVENOX) injection 40 mg Start: 03/06/21 2200     Code Status: Full Code  Family Communication: No family at bedside  Status is: Inpatient   Level of care: Med-Surg  Consultants:  Neurology ID  Procedures:  none  Microbiology  none  Antimicrobials: none    Objective: Vitals:   03/08/21 0542 03/08/21 1314 03/08/21 2025 03/09/21 0535  BP: 108/65 102/63 (!) 96/57 96/61  Pulse: 67 70 (!) 55 (!) 59  Resp: 16 16 18 18   Temp: 97.6 F (36.4 C) 98 F (36.7 C) 98.4 F (36.9 C) (!) 97.4 F (36.3 C)  TempSrc: Oral Oral Oral Oral  SpO2: 100% 97% 97% 97%  Weight:      Height:        Intake/Output Summary (Last 24 hours) at 03/09/2021 1144 Last data filed at 03/09/2021 1000 Gross per 24 hour  Intake 1080 ml  Output --  Net 1080 ml    Filed Weights   03/05/21 1028 03/06/21 1612  Weight: 50.3 kg 50.3 kg    Examination:  Constitutional: NAD, in bed Eyes: No scleral icterus ENMT: mmm Neck: normal, supple Respiratory: Clear bilaterally, no wheezing, no crackles Cardiovascular: Regular rate and rhythm, no murmurs, no edema Abdomen: No distention Musculoskeletal: no clubbing / cyanosis.  Skin: Faint rash on the lower extremities, healing small subcentimeter lesions, no new rashes Neurologic:  Equal strength, no focal deficits  Data Reviewed: I have independently reviewed following labs and imaging studies  CBC: Recent Labs  Lab 03/05/21 1052 03/06/21 0739 03/07/21 0421 03/09/21 0427  WBC 7.2 7.2 9.4 9.4  NEUTROABS  --  6.6  --   --   HGB 12.8 12.1 11.3* 12.2  HCT 38.6 35.8* 34.4* 36.5  MCV 88.5 88.4 90.3 89.2  PLT 291 310 280 370    Basic Metabolic Panel: Recent Labs  Lab 03/05/21 1052 03/06/21 0739  03/07/21 0421 03/09/21 0427  NA 136 139 139 135  K 3.6 3.8 3.3* 3.9  CL 102 106 107 100  CO2 25 22 27 25   GLUCOSE 92 109* 103* 135*  BUN 6 7 13 15   CREATININE 0.49 0.50 0.56 0.53  CALCIUM 9.3 8.4* 8.7* 9.0    Liver Function Tests: Recent Labs  Lab 03/05/21 1427 03/06/21 0739 03/07/21 0421  AST 16 13* 15  ALT 7 6 9   ALKPHOS 52 42 38  BILITOT 1.0 0.6 0.8  PROT 8.6* 7.1 6.7  ALBUMIN 4.4 3.7 3.4*    Coagulation Profile: No results for input(s): INR, PROTIME in the last 168 hours. HbA1C: No results for input(s): HGBA1C in the last 72 hours. CBG: No results for input(s): GLUCAP in the last 168 hours.  Recent Results (from the past 240 hour(s))  Resp Panel by RT-PCR (Flu A&B, Covid) Nasopharyngeal Swab     Status: None   Collection Time: 03/05/21  2:14 PM   Specimen: Nasopharyngeal Swab; Nasopharyngeal(NP) swabs in vial transport medium  Result Value Ref Range Status   SARS Coronavirus 2 by RT PCR NEGATIVE NEGATIVE Final    Comment: (NOTE) SARS-CoV-2 target nucleic acids are NOT DETECTED.  The SARS-CoV-2 RNA is generally detectable in upper respiratory specimens during the acute phase of infection. The lowest concentration of SARS-CoV-2 viral copies this assay can detect is 138 copies/mL. A negative result does not preclude SARS-Cov-2 infection and should not be used as the sole basis for treatment or other patient management decisions. A negative result may occur with  improper specimen collection/handling, submission of specimen other than nasopharyngeal swab, presence of viral mutation(s) within the areas targeted by this assay, and inadequate number of viral copies(<138 copies/mL). A negative result must be combined with clinical observations, patient history, and epidemiological information. The expected result is Negative.  Fact Sheet for Patients:  03/09/21  Fact Sheet for Healthcare Providers:    This test is no t yet approved or cleared by the 03/07/21 FDA and  has been authorized for detection and/or diagnosis of SARS-CoV-2 by FDA under an Emergency Use Authorization (EUA). This EUA will remain  in effect (meaning this test can be used) for the duration of the COVID-19 declaration under Section 564(b)(1) of the Act, 21 U.S.C.section 360bbb-3(b)(1), unless the authorization is terminated  or revoked sooner.       Influenza A by PCR NEGATIVE NEGATIVE Final   Influenza B by PCR NEGATIVE NEGATIVE Final    Comment: (NOTE) The Xpert Xpress SARS-CoV-2/FLU/RSV plus assay is intended as an aid in the diagnosis of influenza from Nasopharyngeal swab specimens and should not be used as a sole basis for treatment. Nasal washings and aspirates are unacceptable for Xpert Xpress SARS-CoV-2/FLU/RSV testing.  Fact Sheet for Patients: BloggerCourse.com  Fact Sheet for Healthcare Providers: SeriousBroker.it  This test is not yet approved or cleared by the [place] FDA and has been authorized for detection and/or diagnosis of SARS-CoV-2 by  FDA under an Emergency Use Authorization (EUA). This EUA will remain in effect (meaning this test can be used) for the duration of the COVID-19 declaration under Section 564(b)(1) of the Act, 21 U.S.C. section 360bbb-3(b)(1), unless the authorization is terminated or revoked.  Performed at [facility], [address] [place], [place] [address]   Blood culture (routine x 2)     Status: None (Preliminary result)   Collection Time: 03/05/21  2:20 PM   Specimen: BLOOD RIGHT ARM  Result Value Ref Range Status   Specimen Description   Final    BLOOD RIGHT ARM Performed at [facility], [address] [place], [place] [address]    Special Requests   Final    BOTTLES DRAWN AEROBIC AND ANAEROBIC Blood Culture  adequate volume Performed at [facility], [address], [place], [place] [address]    Culture   Final    NO GROWTH 4 DAYS Performed at [facility], [address] [place], [place] [address]    Report Status PENDING  Incomplete  Blood culture (routine x 2)     Status: None (Preliminary result)   Collection Time: 03/05/21  2:40 PM   Specimen: BLOOD  Result Value Ref Range Status   Specimen Description BLOOD LEFT ANTECUBITAL  Final   Special Requests   Final    BOTTLES DRAWN AEROBIC AND ANAEROBIC Blood Culture adequate volume   Culture   Final    NO GROWTH 4 DAYS Performed at [facility], [address], [place], [place] [address]    Report Status PENDING  Incomplete      Radiology Studies: No results found.   Pamella Pert, MD, PhD [facility]  Between 7 am - 7 pm I am available, please contact me via Amion (for emergencies) or Securechat (non urgent messages)  Between 7 pm - 7 am I am not available, please contact night coverage MD/APP via Amion

## 2021-03-09 NOTE — Progress Notes (Signed)
Subjective: Continued lower extremity weakness and neck pain   Antibiotics:  Anti-infectives (From admission, onward)    Start     Dose/Rate Route Frequency Ordered Stop   03/05/21 1630  vancomycin (VANCOCIN) IVPB 1000 mg/200 mL premix        1,000 mg 200 mL/hr over 60 Minutes Intravenous  Once 03/05/21 1623 03/05/21 2048   03/05/21 1630  ceFEPIme (MAXIPIME) 2 g in sodium chloride 0.9 % 100 mL IVPB        2 g 200 mL/hr over 30 Minutes Intravenous  Once 03/05/21 1623 03/05/21 1751       Medications: Scheduled Meds:  cyanocobalamin  1,000 mcg Intramuscular Daily   enoxaparin (LOVENOX) injection  40 mg Subcutaneous Q24H   methylPREDNISolone (SOLU-MEDROL) injection  40 mg Intravenous Q12H   Continuous Infusions: PRN Meds:.ibuprofen, ondansetron (ZOFRAN) IV    Objective: Weight change:   Intake/Output Summary (Last 24 hours) at 03/09/2021 1706 Last data filed at 03/09/2021 1400 Gross per 24 hour  Intake 1200 ml  Output --  Net 1200 ml   Blood pressure 101/69, pulse 71, temperature 98.1 F (36.7 C), temperature source Oral, resp. rate 18, height 5\' 3"  (1.6 m), weight 50.3 kg, last menstrual period 02/02/2021, SpO2 98 %. Temp:  [97.4 F (36.3 C)-98.4 F (36.9 C)] 98.1 F (36.7 C) (10/29 1327) Pulse Rate:  [55-71] 71 (10/29 1327) Resp:  [18] 18 (10/29 1327) BP: (96-101)/(57-69) 101/69 (10/29 1327) SpO2:  [97 %-98 %] 98 % (10/29 1327)  Physical Exam: Physical Exam Constitutional:      General: She is not in acute distress.    Appearance: She is well-developed. She is not diaphoretic.  HENT:     Head: Normocephalic and atraumatic.     Right Ear: External ear normal.     Left Ear: External ear normal.     Mouth/Throat:     Pharynx: No oropharyngeal exudate.  Eyes:     General: No scleral icterus.    Conjunctiva/sclera: Conjunctivae normal.     Pupils: Pupils are equal, round, and reactive to light.  Cardiovascular:     Rate and Rhythm: Normal rate  and regular rhythm.     Heart sounds: Normal heart sounds. No murmur heard.   No friction rub. No gallop.  Pulmonary:     Effort: Pulmonary effort is normal. No respiratory distress.     Breath sounds: Normal breath sounds. No wheezing or rales.  Abdominal:     General: Bowel sounds are normal. There is no distension.     Palpations: Abdomen is soft.     Tenderness: There is no abdominal tenderness. There is no rebound.  Musculoskeletal:        General: No tenderness. Normal range of motion.  Lymphadenopathy:     Cervical: No cervical adenopathy.  Skin:    General: Skin is warm and dry.     Coloration: Skin is not pale.     Findings: Rash present. No erythema.  Neurological:     Mental Status: She is alert and oriented to person, place, and time.     Motor: No abnormal muscle tone.  Psychiatric:        Mood and Affect: Mood normal.        Behavior: Behavior normal.        Thought Content: Thought content normal.        Judgment: Judgment normal.     UE 4/5 bilaterally LE: cannot form a straight  leg raise on either side.  Dorsiflexion flexion 5 out of 5  She had some knee pain with internal rotation rotation and external rotation of the hip joint  No evidence of synovitis in wrists hands no effusion in the knee skin rash as below including on the bottom of her feet  03/09/2021:       CBC:    BMET Recent Labs    03/07/21 0421 03/09/21 0427  NA 139 135  K 3.3* 3.9  CL 107 100  CO2 27 25  GLUCOSE 103* 135*  BUN 13 15  CREATININE 0.56 0.53  CALCIUM 8.7* 9.0     Liver Panel  Recent Labs    03/07/21 0421  PROT 6.7  ALBUMIN 3.4*  AST 15  ALT 9  ALKPHOS 38  BILITOT 0.8       Sedimentation Rate No results for input(s): ESRSEDRATE in the last 72 hours. C-Reactive Protein No results for input(s): CRP in the last 72 hours.  Micro Results: Recent Results (from the past 720 hour(s))  Resp Panel by RT-PCR (Flu A&B, Covid) Nasopharyngeal Swab      Status: None   Collection Time: 03/05/21  2:14 PM   Specimen: Nasopharyngeal Swab; Nasopharyngeal(NP) swabs in vial transport medium  Result Value Ref Range Status   SARS Coronavirus 2 by RT PCR NEGATIVE NEGATIVE Final    Comment: (NOTE) SARS-CoV-2 target nucleic acids are NOT DETECTED.  The SARS-CoV-2 RNA is generally detectable in upper respiratory specimens during the acute phase of infection. The lowest concentration of SARS-CoV-2 viral copies this assay can detect is 138 copies/mL. A negative result does not preclude SARS-Cov-2 infection and should not be used as the sole basis for treatment or other patient management decisions. A negative result may occur with  improper specimen collection/handling, submission of specimen other than nasopharyngeal swab, presence of viral mutation(s) within the areas targeted by this assay, and inadequate number of viral copies(<138 copies/mL). A negative result must be combined with clinical observations, patient history, and epidemiological information. The expected result is Negative.  Fact Sheet for Patients:  BloggerCourse.com  Fact Sheet for Healthcare Providers:  SeriousBroker.it  This test is no t yet approved or cleared by the [place] FDA and  has been authorized for detection and/or diagnosis of SARS-CoV-2 by FDA under an Emergency Use Authorization (EUA). This EUA will remain  in effect (meaning this test can be used) for the duration of the COVID-19 declaration under Section 564(b)(1) of the Act, 21 U.S.C.section 360bbb-3(b)(1), unless the authorization is terminated  or revoked sooner.       Influenza A by PCR NEGATIVE NEGATIVE Final   Influenza B by PCR NEGATIVE NEGATIVE Final    Comment: (NOTE) The Xpert Xpress SARS-CoV-2/FLU/RSV plus assay is intended as an aid in the diagnosis of influenza from Nasopharyngeal swab specimens and should not be used as a sole basis  for treatment. Nasal washings and aspirates are unacceptable for Xpert Xpress SARS-CoV-2/FLU/RSV testing.  Fact Sheet for Patients: BloggerCourse.com  Fact Sheet for Healthcare Providers: SeriousBroker.it  This test is not yet approved or cleared by the [place] FDA and has been authorized for detection and/or diagnosis of SARS-CoV-2 by FDA under an Emergency Use Authorization (EUA). This EUA will remain in effect (meaning this test can be used) for the duration of the COVID-19 declaration under Section 564(b)(1) of the Act, 21 U.S.C. section 360bbb-3(b)(1), unless the authorization is terminated or revoked.  Performed at [facility], [phone], [place], [place] [address]   Blood culture (routine x 2)     Status: None (Preliminary result)   Collection Time: 03/05/21  2:20 PM   Specimen: BLOOD RIGHT ARM  Result Value Ref Range Status   Specimen Description   Final    BLOOD RIGHT ARM Performed at [facility], [address], [place], [place] [address]    Special Requests   Final    BOTTLES DRAWN AEROBIC AND ANAEROBIC Blood Culture adequate volume Performed at [facility], [address], [place], [place] [address]    Culture   Final    NO GROWTH 4 DAYS Performed at [facility], [address], [place], [place] [address]    Report Status PENDING  Incomplete  Blood culture (routine x 2)     Status: None (Preliminary result)   Collection Time: 03/05/21  2:40 PM   Specimen: BLOOD  Result Value Ref Range Status   Specimen Description BLOOD LEFT ANTECUBITAL  Final   Special Requests   Final    BOTTLES DRAWN AEROBIC AND ANAEROBIC Blood Culture adequate volume   Culture   Final    NO GROWTH 4 DAYS Performed at [facility], [address] [place], [place] [address]    Report Status PENDING  Incomplete    Studies/Results: No results  found.    Assessment/Plan:  INTERVAL HISTORY: EBV VCA igG and IgM positive   Active Problems:   Weakness   Leg weakness, bilateral    Cheria Sadiq is a 30 y.o. female with a curious history of onset of a vesicular rash on her legs that spread to her torso and arms then accompanied by severe neck pain and stiffness of her shoulders and lower extremity weakness.  #1 Weakness: She still has fairly notable weakness on my exam though I know that neurology does not feel that she has evidence of a neurological deficit and that this is largely musculoskeletal in nature--I would still feel better if we had an MRI of her brain and C-spine to start with--and there is not much risk for this procedure and will give [place] more data  If these are unrevealing I would also like to get an MRI of the lumbar spine  At this point time she does not seem to be responding that dramatically to corticosteroids though I have only seen her today for the first time.  I am adding some labs including an LDH and ferritin and rheumatoid factor  Punch biopsy of skin lesions during the week when pathology can examine the tissue might be helpful  She is on Solu-Medrol 40 every 12  #2 EBV + IgM and IgG: I am skeptical that EBV is significance though it could be if it is reactivated in the context of an association with a malignancy.  I am ordering a full EBV panel as well as EBV PCRs  If she continues to not improve sufficiently would suggest consultation over the phone with a rheumatologist versus transfer to a different tertiary care center for further work-up and treatment.   I spent more than 40 minutes with the patient including  face to face counseling of the patient and her husband with regards to differential for her neck pain shoulder pain lower extremity weakness arthralgias rash personally reviewing chest x-ray CBC CMP sed rate CRP ANCA ANA HIV and hepatitis serologies along with review of medical records in  preparation for the visit and during the visit and in coordination  of her care.    LOS: 2 days   Acey Lav 03/09/2021, 5:06 PM

## 2021-03-09 NOTE — Evaluation (Signed)
Physical Therapy Evaluation Patient Details Name: Rebecca Hughes MRN: [id number] DOB: 28-Jun-1990 Today's Date: 03/09/2021  History of Present Illness  Pt is 30 yo female with no PMH who presented on 03/05/21 with fevers, chills, arthralgias and LE weakness.  About a month ago, developed a rash on legs that appeared like bug bites, then formed vesicles, popped, and scabed over.  Then LE weakness began Monday 10/24.  Unclear etiology at this point - possibly viral illness ad now reactive arthritis, has ID consulted.  Clinical Impression  Pt admitted with above diagnosis. At baseline, pt is completely independent.  She does live in 3rd floor condo but reports could stay with sister in level home if needed.  Today, pt presenting with generalized weakness (more in LE) and pain in shoulder and back.  She ambulated 27' but slowly, with difficulty, and slid feet. She is well below her baseline.  Discussed inpatient rehab as option - pt is open to his but wants to see how she progresses with steroids.  Pt currently with functional limitations due to the deficits listed below (see PT Problem List). Pt will benefit from skilled PT to increase their independence and safety with mobility to allow discharge to the venue listed below.          Recommendations for follow up therapy are one component of a multi-disciplinary discharge planning process, led by the attending physician.  Recommendations may be updated based on patient status, additional functional criteria and insurance authorization.  Follow Up Recommendations Acute inpatient rehab (3hours/day)    Assistance Recommended at Discharge Frequent or constant Supervision/Assistance  Functional Status Assessment Patient has had a recent decline in their functional status and demonstrates the ability to make significant improvements in function in a reasonable and predictable amount of time.  Equipment Recommendations  Rolling walker (2 wheels)     Recommendations for Other Services Rehab consult     Precautions / Restrictions Precautions Precautions: Fall      Mobility  Bed Mobility Overal bed mobility: Needs Assistance Bed Mobility: Supine to Sit;Sit to Supine     Supine to sit: Supervision;HOB elevated Sit to supine: Supervision;HOB elevated   General bed mobility comments: increased time but no physical assist    Transfers Overall transfer level: Needs assistance Equipment used: Rolling walker (2 wheels) Transfers: Sit to/from Stand Sit to Stand: Min assist           General transfer comment: Min A to rise with cues for hand placement    Ambulation/Gait Ambulation/Gait assistance: Min assist Gait Distance (Feet): 70 Feet Assistive device: Rolling walker (2 wheels) Gait Pattern/deviations: Step-to pattern;Decreased stride length;Shuffle Gait velocity: decreased Gait velocity interpretation: <1.8 ft/sec, indicate of risk for recurrent falls General Gait Details: Pt fatiguing very easily and slides feet forward (no foot clearance).  Min A to steady with cues for RW and reliance on RW  Stairs            Wheelchair Mobility    Modified Rankin (Stroke Patients Only)       Balance Overall balance assessment: Needs assistance Sitting-balance support: No upper extremity supported Sitting balance-Leahy Scale: Good     Standing balance support: Bilateral upper extremity supported Standing balance-Leahy Scale: Poor Standing balance comment: requiring RW                             Pertinent Vitals/Pain Pain Assessment: 0-10 Pain Score: 4  Pain Location: R shoulder Pain  Descriptors / Indicators: Discomfort;Sore Pain Intervention(s): Limited activity within patient's tolerance;Monitored during session    Home Living Family/patient expects to be discharged to:: Private residence Living Arrangements: Spouse/significant other (husband) Available Help at Discharge: Family;Available 24  hours/day Type of Home: Other(Comment) (condo) Home Access: Stairs to enter Entrance Stairs-Rails: Right;Left;Can reach both Entrance Stairs-Number of Steps: 2 flights (3rd floor condo)   Home Layout: One level Home Equipment: Grab bars - tub/shower      Prior Function Prior Level of Function : Independent/Modified Independent             Mobility Comments: Completely independent       Hand Dominance        Extremity/Trunk Assessment   Upper Extremity Assessment Upper Extremity Assessment: RUE deficits/detail;LUE deficits/detail RUE Deficits / Details: ROM WFL (painful shoulder elevation); MMT 4-/5 LUE Deficits / Details: ROM WFL (painful shoulder elevation); MMT 4-/5    Lower Extremity Assessment Lower Extremity Assessment: LLE deficits/detail;RLE deficits/detail RLE Deficits / Details: ROM WFL; MMT 3/5 knee ext, 3/5 hip, 4/5 ankle LLE Deficits / Details: ROM WFL; MMT 3/5 knee ext, 3/5 hip, 4/5 ankle    Cervical / Trunk Assessment Cervical / Trunk Assessment: Other exceptions Cervical / Trunk Exceptions: painful -straight, limited rotation  Communication   Communication: No difficulties  Cognition Arousal/Alertness: Awake/alert Behavior During Therapy: WFL for tasks assessed/performed Overall Cognitive Status: Within Functional Limits for tasks assessed                                 General Comments: 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  She would like to see how she progresses over next couple days.  Also, discussed will add OT consult.    Exercises     Assessment/Plan    PT Assessment Patient needs continued PT services  PT Problem List Decreased strength;Decreased mobility;Decreased range of motion;Decreased activity tolerance;Decreased  balance;Decreased knowledge of use of DME;Pain       PT Treatment Interventions DME instruction;Therapeutic activities;Gait training;Therapeutic exercise;Patient/family education;Stair training;Functional mobility training;Modalities;Balance training    PT Goals (Current goals can be found in the Care Plan section)  Acute Rehab PT Goals Patient Stated Goal: return to normal PT Goal Formulation: With patient/family Time For Goal Achievement: 03/23/21 Potential to Achieve Goals: Good Additional Goals Additional Goal #1: LE strength to 5/5 for improved stairs and gait    Frequency Min 3X/week   Barriers to discharge Inaccessible home environment 3rd floor condo but reports could stay with sister in level home    Co-evaluation               AM-PAC PT "6 Clicks" Mobility  Outcome Measure Help needed turning from your back to your side while in a flat bed without using bedrails?: A Little Help needed moving from lying on your back to sitting on the side of a flat bed without using bedrails?: A Little Help needed moving to and from a bed to a chair (including a wheelchair)?: A Little Help needed standing up from a chair using your arms (e.g., wheelchair or bedside chair)?: A Little Help needed to walk in hospital room?: A Little Help needed climbing 3-5 steps with a railing? : A Lot 6 Click Score: 17  End of Session Equipment Utilized During Treatment: Gait belt Activity Tolerance: Patient tolerated treatment well Patient left: in bed;with call bell/phone within reach;with family/visitor present Nurse Communication: Mobility status PT Visit Diagnosis: Other abnormalities of gait and mobility (R26.89);Muscle weakness (generalized) (M62.81);Pain Pain - Right/Left: Right Pain - part of body: Shoulder    Time: 1349-1416 PT Time Calculation (min) (ACUTE ONLY): 27 min   Charges:   PT Evaluation $PT Eval Low Complexity: 1 Low PT Treatments $Gait Training: 8-22 mins         Anise Salvo, PT Acute Rehab Services Pager [phone] [facility] [phone]   Rayetta Humphrey 03/09/2021, 2:41 PM

## 2021-03-10 ENCOUNTER — Inpatient Hospital Stay (HOSPITAL_COMMUNITY): Payer: BLUE CROSS/BLUE SHIELD

## 2021-03-10 ENCOUNTER — Encounter (HOSPITAL_COMMUNITY): Payer: Self-pay | Admitting: Internal Medicine

## 2021-03-10 DIAGNOSIS — M542 Cervicalgia: Secondary | ICD-10-CM | POA: Diagnosis not present

## 2021-03-10 DIAGNOSIS — R531 Weakness: Secondary | ICD-10-CM | POA: Diagnosis not present

## 2021-03-10 DIAGNOSIS — M791 Myalgia, unspecified site: Secondary | ICD-10-CM | POA: Diagnosis not present

## 2021-03-10 DIAGNOSIS — R5383 Other fatigue: Secondary | ICD-10-CM

## 2021-03-10 DIAGNOSIS — R29898 Other symptoms and signs involving the musculoskeletal system: Secondary | ICD-10-CM | POA: Diagnosis not present

## 2021-03-10 LAB — MISC LABCORP TEST (SEND OUT): Labcorp test code: 164798

## 2021-03-10 LAB — CULTURE, BLOOD (ROUTINE X 2)
Culture: NO GROWTH
Culture: NO GROWTH
Special Requests: ADEQUATE
Special Requests: ADEQUATE

## 2021-03-10 MED ORDER — GADOBUTROL 1 MMOL/ML IV SOLN
5.0000 mL | Freq: Once | INTRAVENOUS | Status: AC | PRN
  Administered 2021-03-10: 5 mL via INTRAVENOUS

## 2021-03-10 MED ORDER — ADULT MULTIVITAMIN W/MINERALS CH
1.0000 | ORAL_TABLET | Freq: Every day | ORAL | Status: DC
  Administered 2021-03-10 – 2021-03-12 (×3): 1 via ORAL
  Filled 2021-03-10 (×3): qty 1

## 2021-03-10 NOTE — Progress Notes (Signed)
Inpatient Rehab Admissions Coordinator:   Per therapy recommendations,  patient was screened for CIR candidacy by Megan Salon, MS, CCC-SLP. Work up is currently incomplete, with MRIs still pending, so I am unable to make an assessment of candidacy at this time. I will rescreen once work up is complete.  Please contact me any with questions.  Megan Salon, MS, CCC-SLP Rehab Admissions Coordinator  [phone] (celll) [phone] (office)

## 2021-03-10 NOTE — Evaluation (Signed)
Occupational Therapy Evaluation Patient Details Name: Rebecca Hughes MRN: [id number] DOB: 1991/03/27 Today's Date: 03/10/2021   History of Present Illness Pt is 30 yo female with no PMH who presented on 03/05/21 with fevers, chills, arthralgias and LE weakness.  About a month ago, developed a rash on legs that appeared like bug bites, then formed vesicles, popped, and scabed over.  Then LE weakness began Monday 10/24.  Unclear etiology at this point - possibly viral illness ad now reactive arthritis, has ID consulted.   Clinical Impression   Patient is a 30 year old female who was independent in ADLs, IADLs and working prior to hospitalization. Currently, patient is noted to have a significant functional decline impacting participation in ADLs and functional mobility. Patient is min/mod A for all ADLs with decreased activity tolerance, decreased endurance, increased pain, decreased safety awareness, and decreased knowledge about DME/compensatory strategies.Patient is motivated to get back to PLOF. Patient would continue to benefit from skilled OT services at this time while admitted and after d/c to address noted deficits in order to improve overall safety and independence in ADLs.        Recommendations for follow up therapy are one component of a multi-disciplinary discharge planning process, led by the attending physician.  Recommendations may be updated based on patient status, additional functional criteria and insurance authorization.   Follow Up Recommendations  Acute inpatient rehab (3hours/day)    Assistance Recommended at Discharge Frequent or constant Supervision/Assistance  Functional Status Assessment  Patient has had a recent decline in their functional status and demonstrates the ability to make significant improvements in function in a reasonable and predictable amount of time.  Equipment Recommendations  Other (comment) (RW)    Recommendations for Other Services        Precautions / Restrictions Precautions Precautions: Fall Restrictions Weight Bearing Restrictions: No      Mobility Bed Mobility Overal bed mobility: Needs Assistance Bed Mobility: Supine to Sit;Sit to Supine     Supine to sit: Supervision;HOB elevated     General bed mobility comments: increased time but no physical assist    Transfers Overall transfer level: Needs assistance Equipment used: Rolling walker (2 wheels) Transfers: Sit to/from Stand Sit to Stand: Min assist           General transfer comment: Min A to rise with cues for hand placement with increased time      Balance Overall balance assessment: Needs assistance Sitting-balance support: No upper extremity supported Sitting balance-Leahy Scale: Good     Standing balance support: Bilateral upper extremity supported;Reliant on assistive device for balance Standing balance-Leahy Scale: Poor Standing balance comment: requiring RW                           ADL either performed or assessed with clinical judgement   ADL Overall ADL's : Needs assistance/impaired Eating/Feeding: Modified independent;Sitting   Grooming: Wash/dry face;Sitting;Set up   Upper Body Bathing: Sitting;Set up   Lower Body Bathing: Sit to/from stand;Minimal assistance Lower Body Bathing Details (indicate cue type and reason): patient is noted to fatigue quickly with needed assistance and compensatory strategies to aovid pressure on neck with bending for task. Upper Body Dressing : Set up;Sitting   Lower Body Dressing: Sit to/from stand;Minimal assistance Lower Body Dressing Details (indicate cue type and reason): patient was able to don bilateral socks sitting on edge of bed with education to bring legs up to lap with increased time v.s. bending  and placing more pressure on neck. Toilet Transfer: Minimal assistance;Rolling walker (2 wheels) Toilet Transfer Details (indicate cue type and reason): patient was able to  transfer from edge of bed to recliner in room with increased time with education on proper hand placement. patient declined to use bathroom at this time Toileting- Architect and Hygiene: Moderate assistance;Sit to/from stand       Functional mobility during ADLs: Min guard;Rolling walker (2 wheels) General ADL Comments: patient was able to complete functional mobility with focus on getting feet off the floor from recliner to wall in room and back with increased time and noted WB through BUE to participate in task. patient and husband were extensively educated on benefits of inpatient rehab v.s. transitioning home. patient and husband verbalized understanding.     Vision Patient Visual Report: No change from baseline       Perception     Praxis      Pertinent Vitals/Pain Pain Assessment: Faces Faces Pain Scale: Hurts little more Pain Location: neck "feels like i got hit" Pain Descriptors / Indicators: Discomfort;Sore Pain Intervention(s): Limited activity within patient's tolerance;Monitored during session     Hand Dominance Right   Extremity/Trunk Assessment Upper Extremity Assessment Upper Extremity Assessment: RUE deficits/detail;LUE deficits/detail RUE: Unable to fully assess due to pain LUE: Unable to fully assess due to pain   Lower Extremity Assessment Lower Extremity Assessment: Defer to PT evaluation   Cervical / Trunk Assessment Cervical / Trunk Assessment: Other exceptions Cervical / Trunk Exceptions: patient noted to have more kyphotic posture over RW.   Communication Communication Communication: No difficulties   Cognition Arousal/Alertness: Awake/alert Behavior During Therapy: WFL for tasks assessed/performed Overall Cognitive Status: Within Functional Limits for tasks assessed                                 General Comments: patient's husband was present in room as well during session.     General Comments       Exercises      Shoulder Instructions      Home Living Family/patient expects to be discharged to:: Private residence Living Arrangements: Spouse/significant other Available Help at Discharge: Family;Available 24 hours/day Type of Home: Other(Comment) Home Access: Stairs to enter Entrance Stairs-Number of Steps: 2 flights (3rd floor condo) Entrance Stairs-Rails: Right;Left;Can reach both Home Layout: One level     Bathroom Shower/Tub: Tub/shower unit         Home Equipment: Grab bars - tub/shower          Prior Functioning/Environment Prior Level of Function : Independent/Modified Independent             Mobility Comments: Completely independent ADLs Comments: patient was independent in ADLs, IADLs and driving, patient was traveling for work prior to hosptialization        OT Problem List: Decreased strength;Decreased range of motion;Decreased activity tolerance;Impaired balance (sitting and/or standing);Decreased safety awareness;Decreased coordination;Decreased knowledge of use of DME or AE      OT Treatment/Interventions: Self-care/ADL training;Therapeutic exercise;Neuromuscular education;Energy conservation;DME and/or AE instruction;Therapeutic activities;Balance training;Patient/family education    OT Goals(Current goals can be found in the care plan section) Acute Rehab OT Goals Patient Stated Goal: to get better OT Goal Formulation: With patient Time For Goal Achievement: 03/24/21 Potential to Achieve Goals: Good  OT Frequency: Min 2X/week   Barriers to D/C:    patient has various sets of  steps to enter house  Co-evaluation              AM-PAC OT "6 Clicks" Daily Activity     Outcome Measure Help from another person eating meals?: None Help from another person taking care of personal grooming?: A Little Help from another person toileting, which includes using toliet, bedpan, or urinal?: A Lot Help from another person bathing (including washing,  rinsing, drying)?: A Lot Help from another person to put on and taking off regular upper body clothing?: A Little Help from another person to put on and taking off regular lower body clothing?: A Little 6 Click Score: 17   End of Session Equipment Utilized During Treatment: Rolling walker (2 wheels) Nurse Communication: Other (comment) (nurse cleared patient to participate in session)  Activity Tolerance: Patient limited by fatigue Patient left: in chair;with call bell/phone within reach;with family/visitor present  OT Visit Diagnosis: Unsteadiness on feet (R26.81);Other abnormalities of gait and mobility (R26.89);Muscle weakness (generalized) (M62.81)                Time: 9678-9381 OT Time Calculation (min): 32 min Charges:  OT General Charges $OT Visit: 1 Visit OT Evaluation $OT Eval Low Complexity: 1 Low OT Treatments $Self Care/Home Management : 8-22 mins  Sharyn Blitz OTR/L, MS Acute Rehabilitation Department Office# [phone] Pager# [phone]   Chalmers Guest Jakolby Sedivy 03/10/2021, 3:51 PM

## 2021-03-10 NOTE — Progress Notes (Signed)
PROGRESS NOTE  Rebecca Hughes NID:[id number] DOB: 05-30-1990 DOA: 03/05/2021 PCP: Pcp, No   LOS: 3 days   Brief Narrative / Interim history: 30 year old female with no significant past medical history comes into the hospital with fevers, chills, arthralgias as well as lower extremity weakness.  About a month ago, while traveling for business, has noticed a rash mainly located on her legs, initially looking like bug bites but then progressed to form vesicles with a clear liquid, eventually popped and started to scab over.  So few lesions on her back, thighs, arms but mostly were located on the legs.  She is also been complaining of stiffness in her shoulders, neck, having difficulties rotating her neck.  Has been having swelling in her knees, pain, as well as progressive weakness to the point that she was unable to walk and came to the hospital.  Subjective / 24h Interval events: States that she is overall feeling better however she is somewhat taken aback that has ongoing difficulties walking  Assessment & Plan: Principal Problem Generalized lower extremity weakness, polyarthralgia, rash-unclear etiology at this point, possibly had a viral illness and now has reactive arthritis.  ID and neurology consulted. -HIV negative.  CMV IgM negative, RPR negative, ANA negative, ANCA panel negative, TSH normal, LDH normal, ferritin normal -Histoplasma antigen pending, coccidioides Immitis IgM and IgG pending, Parvovirus B19 pending, rheumatoid factor pending, full EBV panel pending -vitamin B12 normal but on the low side, supplement -Obtain PT evaluated as well -Continue steroids -EBV VCA IgM and IgG elevated, suggesting recent infection versus reactivation discussed with ID, not sure whether is related to her current condition -Obtain brain and C-spine MRI  Active problems Hypokalemia-repleted  Scheduled Meds:  cyanocobalamin  1,000 mcg Intramuscular Daily   enoxaparin (LOVENOX) injection  40 mg  Subcutaneous Q24H   methylPREDNISolone (SOLU-MEDROL) injection  40 mg Intravenous Q12H   multivitamin with minerals  1 tablet Oral Daily   Continuous Infusions: PRN Meds:.ibuprofen, ondansetron (ZOFRAN) IV  Diet Orders (From admission, onward)     Start     Ordered   03/06/21 1828  Diet regular Room service appropriate? Yes; Fluid consistency: Thin  Diet effective now       Question Answer Comment  Room service appropriate? Yes   Fluid consistency: Thin      03/06/21 1827            DVT prophylaxis: enoxaparin (LOVENOX) injection 40 mg Start: 03/06/21 2200     Code Status: Full Code  Family Communication: No family at bedside  Status is: Inpatient   Level of care: Med-Surg  Consultants:  Neurology ID  Procedures:  none  Microbiology  none  Antimicrobials: none    Objective: Vitals:   03/09/21 0535 03/09/21 1327 03/09/21 2015 03/10/21 0556  BP: 96/61 101/69 (!) 95/56 101/63  Pulse: (!) 59 71 (!) 56 63  Resp: 18 18 18 18   Temp: (!) 97.4 F (36.3 C) 98.1 F (36.7 C) 98 F (36.7 C) (!) 97.5 F (36.4 C)  TempSrc: Oral Oral Oral Oral  SpO2: 97% 98% 99% 99%  Weight:      Height:        Intake/Output Summary (Last 24 hours) at 03/10/2021 1155 Last data filed at 03/10/2021 0600 Gross per 24 hour  Intake 540 ml  Output --  Net 540 ml    Filed Weights   03/05/21 1028 03/06/21 1612  Weight: 50.3 kg 50.3 kg    Examination:  Constitutional: NAD Eyes: Anicteric ENMT:  mmm Neck: normal, supple Respiratory: Clear bilaterally without wheezing or crackles Cardiovascular: Regular rate and rhythm without murmurs Abdomen: No distention Musculoskeletal: no clubbing / cyanosis.  Skin: Faint rash on the lower extremities, healing small subcentimeter lesions, no new rashes Neurologic: 4/5 lower extremities, equal, otherwise unremarkable  Data Reviewed: I have independently reviewed following labs and imaging studies  CBC: Recent Labs  Lab  03/05/21 1052 03/06/21 0739 03/07/21 0421 03/09/21 0427  WBC 7.2 7.2 9.4 9.4  NEUTROABS  --  6.6  --   --   HGB 12.8 12.1 11.3* 12.2  HCT 38.6 35.8* 34.4* 36.5  MCV 88.5 88.4 90.3 89.2  PLT 291 310 280 370    Basic Metabolic Panel: Recent Labs  Lab 03/05/21 1052 03/06/21 0739 03/07/21 0421 03/09/21 0427  NA 136 139 139 135  K 3.6 3.8 3.3* 3.9  CL 102 106 107 100  CO2 25 22 27 25   GLUCOSE 92 109* 103* 135*  BUN 6 7 13 15   CREATININE 0.49 0.50 0.56 0.53  CALCIUM 9.3 8.4* 8.7* 9.0    Liver Function Tests: Recent Labs  Lab 03/05/21 1427 03/06/21 0739 03/07/21 0421  AST 16 13* 15  ALT 7 6 9   ALKPHOS 52 42 38  BILITOT 1.0 0.6 0.8  PROT 8.6* 7.1 6.7  ALBUMIN 4.4 3.7 3.4*    Coagulation Profile: No results for input(s): INR, PROTIME in the last 168 hours. HbA1C: No results for input(s): HGBA1C in the last 72 hours. CBG: No results for input(s): GLUCAP in the last 168 hours.  Recent Results (from the past 240 hour(s))  Resp Panel by RT-PCR (Flu A&B, Covid) Nasopharyngeal Swab     Status: None   Collection Time: 03/05/21  2:14 PM   Specimen: Nasopharyngeal Swab; Nasopharyngeal(NP) swabs in vial transport medium  Result Value Ref Range Status   SARS Coronavirus 2 by RT PCR NEGATIVE NEGATIVE Final    Comment: (NOTE) SARS-CoV-2 target nucleic acids are NOT DETECTED.  The SARS-CoV-2 RNA is generally detectable in upper respiratory specimens during the acute phase of infection. The lowest concentration of SARS-CoV-2 viral copies this assay can detect is 138 copies/mL. A negative result does not preclude SARS-Cov-2 infection and should not be used as the sole basis for treatment or other patient management decisions. A negative result may occur with  improper specimen collection/handling, submission of specimen other than nasopharyngeal swab, presence of viral mutation(s) within the areas targeted by this assay, and inadequate number of viral copies(<138  copies/mL). A negative result must be combined with clinical observations, patient history, and epidemiological information. The expected result is Negative.  Fact Sheet for Patients:  03/09/21  Fact Sheet for Healthcare Providers:   This test is no t yet approved or cleared by the 03/07/21 FDA and  has been authorized for detection and/or diagnosis of SARS-CoV-2 by FDA under an Emergency Use Authorization (EUA). This EUA will remain  in effect (meaning this test can be used) for the duration of the COVID-19 declaration under Section 564(b)(1) of the Act, 21 U.S.C.section 360bbb-3(b)(1), unless the authorization is terminated  or revoked sooner.       Influenza A by PCR NEGATIVE NEGATIVE Final   Influenza B by PCR NEGATIVE NEGATIVE Final    Comment: (NOTE) The Xpert Xpress SARS-CoV-2/FLU/RSV plus assay is intended as an aid in the diagnosis of influenza from Nasopharyngeal swab specimens and should not be used as a sole basis for treatment. Nasal washings and aspirates are unacceptable  for Xpert Xpress SARS-CoV-2/FLU/RSV testing.  Fact Sheet for Patients: BloggerCourse.com  Fact Sheet for Healthcare Providers: SeriousBroker.it  This test is not yet approved or cleared by the [place] FDA and has been authorized for detection and/or diagnosis of SARS-CoV-2 by FDA under an Emergency Use Authorization (EUA). This EUA will remain in effect (meaning this test can be used) for the duration of the COVID-19 declaration under Section 564(b)(1) of the Act, 21 U.S.C. section 360bbb-3(b)(1), unless the authorization is terminated or revoked.  Performed at [facility], [address], [place], [place] [address]   Blood culture (routine x 2)     Status: None   Collection Time: 03/05/21  2:20 PM   Specimen: BLOOD RIGHT ARM  Result  Value Ref Range Status   Specimen Description   Final    BLOOD RIGHT ARM Performed at [facility], [address] [place], [place] [address]    Special Requests   Final    BOTTLES DRAWN AEROBIC AND ANAEROBIC Blood Culture adequate volume Performed at [facility], [address], [place], [place] [address]    Culture   Final    NO GROWTH 5 DAYS Performed at [facility], [address] [place], [place] [address]    Report Status 03/10/2021 FINAL  Final  Blood culture (routine x 2)     Status: None   Collection Time: 03/05/21  2:40 PM   Specimen: BLOOD  Result Value Ref Range Status   Specimen Description BLOOD LEFT ANTECUBITAL  Final   Special Requests   Final    BOTTLES DRAWN AEROBIC AND ANAEROBIC Blood Culture adequate volume   Culture   Final    NO GROWTH 5 DAYS Performed at [facility], [address], [place], [place] [address]    Report Status 03/10/2021 FINAL  Final      Radiology Studies: No results found.   Pamella Pert, MD, PhD [facility]  Between 7 am - 7 pm I am available, please contact me via Amion (for emergencies) or Securechat (non urgent messages)  Between 7 pm - 7 am I am not available, please contact night coverage MD/APP via Amion

## 2021-03-10 NOTE — Progress Notes (Signed)
Brief Neuro Update:  Vit B12 is low from a neurological standpoint. Ideally want it to be above 500. I would replace Vit B12 PO and add an additional multivitamin PO daily as she is likely deficient in other vitamins given low normal B12 levels. Micronutrient deficiency can cause lethargy and tiredness, althou would not explain her rash and arthralgia. Not much downside to replacing Vitamins thou.  Erick Blinks [facility] Pager Number [phone]

## 2021-03-10 NOTE — Progress Notes (Signed)
Subjective:  Worsening knee pain   Antibiotics:  Anti-infectives (From admission, onward)    Start     Dose/Rate Route Frequency Ordered Stop   03/05/21 1630  vancomycin (VANCOCIN) IVPB 1000 mg/200 mL premix        1,000 mg 200 mL/hr over 60 Minutes Intravenous  Once 03/05/21 1623 03/05/21 2048   03/05/21 1630  ceFEPIme (MAXIPIME) 2 g in sodium chloride 0.9 % 100 mL IVPB        2 g 200 mL/hr over 30 Minutes Intravenous  Once 03/05/21 1623 03/05/21 1751       Medications: Scheduled Meds:  cyanocobalamin  1,000 mcg Intramuscular Daily   enoxaparin (LOVENOX) injection  40 mg Subcutaneous Q24H   methylPREDNISolone (SOLU-MEDROL) injection  40 mg Intravenous Q12H   multivitamin with minerals  1 tablet Oral Daily   Continuous Infusions: PRN Meds:.ibuprofen, ondansetron (ZOFRAN) IV    Objective: Weight change:   Intake/Output Summary (Last 24 hours) at 03/10/2021 1523 Last data filed at 03/10/2021 0600 Gross per 24 hour  Intake 300 ml  Output --  Net 300 ml    Blood pressure 112/71, pulse 71, temperature 98.2 F (36.8 C), temperature source Oral, resp. rate 18, height 5\' 3"  (1.6 m), weight 50.3 kg, last menstrual period 02/02/2021, SpO2 97 %. Temp:  [97.5 F (36.4 C)-98.2 F (36.8 C)] 98.2 F (36.8 C) (10/30 1413) Pulse Rate:  [56-71] 71 (10/30 1413) Resp:  [18] 18 (10/30 1413) BP: (95-112)/(56-71) 112/71 (10/30 1413) SpO2:  [97 %-99 %] 97 % (10/30 1413)  Physical Exam: Physical Exam Constitutional:      General: She is not in acute distress.    Appearance: She is well-developed. She is not diaphoretic.  HENT:     Head: Normocephalic and atraumatic.     Right Ear: External ear normal.     Left Ear: External ear normal.     Mouth/Throat:     Pharynx: No oropharyngeal exudate.  Eyes:     General: No scleral icterus.    Extraocular Movements: Extraocular movements intact.     Conjunctiva/sclera: Conjunctivae normal.  Cardiovascular:     Rate and  Rhythm: Normal rate and regular rhythm.     Heart sounds:    Friction rub present.  Pulmonary:     Effort: Pulmonary effort is normal. No respiratory distress.     Breath sounds: No wheezing.  Abdominal:     General: Bowel sounds are normal. There is no distension.     Palpations: Abdomen is soft.     Tenderness: There is no abdominal tenderness.  Musculoskeletal:        General: No tenderness. Normal range of motion.  Lymphadenopathy:     Cervical: No cervical adenopathy.  Skin:    General: Skin is warm and dry.     Coloration: Skin is not pale.     Findings: Rash present. No erythema.  Neurological:     Mental Status: She is alert and oriented to person, place, and time.     Motor: No abnormal muscle tone.  Psychiatric:        Mood and Affect: Mood normal.        Behavior: Behavior normal.        Thought Content: Thought content normal.        Judgment: Judgment normal.     UE 4/5 bilaterally LE: today can raise leg off of bed with PT  Some discomfort with bending knees and  still limited mobility of neck  03/09/2021:       CBC:    BMET Recent Labs    03/09/21 0427  NA 135  K 3.9  CL 100  CO2 25  GLUCOSE 135*  BUN 15  CREATININE 0.53  CALCIUM 9.0      Liver Panel  No results for input(s): PROT, ALBUMIN, AST, ALT, ALKPHOS, BILITOT, BILIDIR, IBILI in the last 72 hours.      Sedimentation Rate No results for input(s): ESRSEDRATE in the last 72 hours. C-Reactive Protein No results for input(s): CRP in the last 72 hours.  Micro Results: Recent Results (from the past 720 hour(s))  Resp Panel by RT-PCR (Flu A&B, Covid) Nasopharyngeal Swab     Status: None   Collection Time: 03/05/21  2:14 PM   Specimen: Nasopharyngeal Swab; Nasopharyngeal(NP) swabs in vial transport medium  Result Value Ref Range Status   SARS Coronavirus 2 by RT PCR NEGATIVE NEGATIVE Final    Comment: (NOTE) SARS-CoV-2 target nucleic acids are NOT DETECTED.  The SARS-CoV-2  RNA is generally detectable in upper respiratory specimens during the acute phase of infection. The lowest concentration of SARS-CoV-2 viral copies this assay can detect is 138 copies/mL. A negative result does not preclude SARS-Cov-2 infection and should not be used as the sole basis for treatment or other patient management decisions. A negative result may occur with  improper specimen collection/handling, submission of specimen other than nasopharyngeal swab, presence of viral mutation(s) within the areas targeted by this assay, and inadequate number of viral copies(<138 copies/mL). A negative result must be combined with clinical observations, patient history, and epidemiological information. The expected result is Negative.  Fact Sheet for Patients:  BloggerCourse.com  Fact Sheet for Healthcare Providers:  SeriousBroker.it  This test is no t yet approved or cleared by the [place] FDA and  has been authorized for detection and/or diagnosis of SARS-CoV-2 by FDA under an Emergency Use Authorization (EUA). This EUA will remain  in effect (meaning this test can be used) for the duration of the COVID-19 declaration under Section 564(b)(1) of the Act, 21 U.S.C.section 360bbb-3(b)(1), unless the authorization is terminated  or revoked sooner.       Influenza A by PCR NEGATIVE NEGATIVE Final   Influenza B by PCR NEGATIVE NEGATIVE Final    Comment: (NOTE) The Xpert Xpress SARS-CoV-2/FLU/RSV plus assay is intended as an aid in the diagnosis of influenza from Nasopharyngeal swab specimens and should not be used as a sole basis for treatment. Nasal washings and aspirates are unacceptable for Xpert Xpress SARS-CoV-2/FLU/RSV testing.  Fact Sheet for Patients: BloggerCourse.com  Fact Sheet for Healthcare Providers: SeriousBroker.it  This test is not yet approved or cleared by the  [place] FDA and has been authorized for detection and/or diagnosis of SARS-CoV-2 by FDA under an Emergency Use Authorization (EUA). This EUA will remain in effect (meaning this test can be used) for the duration of the COVID-19 declaration under Section 564(b)(1) of the Act, 21 U.S.C. section 360bbb-3(b)(1), unless the authorization is terminated or revoked.  Performed at [facility], [address] [place], [place] [address]   Blood culture (routine x 2)     Status: None   Collection Time: 03/05/21  2:20 PM   Specimen: BLOOD RIGHT ARM  Result Value Ref Range Status   Specimen Description   Final    BLOOD RIGHT ARM Performed at [facility], [address] [place], [place] [address]    Special  Requests   Final    BOTTLES DRAWN AEROBIC AND ANAEROBIC Blood Culture adequate volume Performed at [facility], [address], [place], [place] [address]    Culture   Final    NO GROWTH 5 DAYS Performed at [facility], [address], [place], [place] [address]    Report Status 03/10/2021 FINAL  Final  Blood culture (routine x 2)     Status: None   Collection Time: 03/05/21  2:40 PM   Specimen: BLOOD  Result Value Ref Range Status   Specimen Description BLOOD LEFT ANTECUBITAL  Final   Special Requests   Final    BOTTLES DRAWN AEROBIC AND ANAEROBIC Blood Culture adequate volume   Culture   Final    NO GROWTH 5 DAYS Performed at [facility], [address], [place], [place] [address]    Report Status 03/10/2021 FINAL  Final    Studies/Results: MR BRAIN W WO CONTRAST  Result Date: 03/10/2021 CLINICAL DATA:  Neuro deficit, stroke suspected. EXAM: MRI HEAD WITHOUT AND WITH CONTRAST TECHNIQUE: Multiplanar, multiecho pulse sequences of the brain and surrounding structures were obtained without and with intravenous contrast. CONTRAST:  47mL GADAVIST GADOBUTROL 1 MMOL/ML IV SOLN COMPARISON:  None. FINDINGS:  Brain: No acute infarct, hemorrhage, or mass lesion is present. No significant white matter lesions are present. The ventricles are of normal size. No significant extraaxial fluid collection is present. The internal auditory canals are within normal limits. The brainstem and cerebellum are within normal limits. Postcontrast images demonstrate no pathologic enhancement. Vascular: Flow is present in the major intracranial arteries. Skull and upper cervical spine: The craniocervical junction is normal. Upper cervical spine is within normal limits. Marrow signal is unremarkable. Sinuses/Orbits: The paranasal sinuses and mastoid air cells are clear. The globes and orbits are within normal limits. IMPRESSION: Negative MRI of the brain without and with contrast. No acute or focal etiology for the patient's lower extremity weakness. Electronically Signed   By: Marin Roberts M.D.   On: 03/10/2021 12:35   MR CERVICAL SPINE W WO CONTRAST  Result Date: 03/10/2021 CLINICAL DATA:  Epidural abscess EXAM: MRI CERVICAL SPINE WITHOUT AND WITH CONTRAST TECHNIQUE: Multiplanar and multiecho pulse sequences of the cervical spine, to include the craniocervical junction and cervicothoracic junction, were obtained without and with intravenous contrast. CONTRAST:  44mL GADAVIST GADOBUTROL 1 MMOL/ML IV SOLN COMPARISON:  None. FINDINGS: Alignment: Physiologic. Vertebrae: No acute fracture, evidence of discitis, or bone lesion. Cord: Normal signal and morphology. Posterior Fossa, vertebral arteries, paraspinal tissues: Posterior fossa demonstrates no focal abnormality. Vertebral artery flow voids are maintained. Paraspinal soft tissues are unremarkable. Disc levels: Discs: Disc spaces are maintained. C2-3: No significant disc bulge. No neural foraminal stenosis. No central canal stenosis. C3-4: No significant disc bulge. No neural foraminal stenosis. No central canal stenosis. C4-5: No significant disc bulge. No neural foraminal  stenosis. No central canal stenosis. C5-6: No significant disc bulge. No neural foraminal stenosis. No central canal stenosis. C6-7: No significant disc bulge. No neural foraminal stenosis. No central canal stenosis. C7-T1: No significant disc bulge. No neural foraminal stenosis. No central canal stenosis. IMPRESSION: 1. Normal MRI of the cervical spine. 2. No evidence of a cervical epidural abscess. Electronically Signed   By: Elige Ko M.D.   On: 03/10/2021 12:18      Assessment/Plan:  INTERVAL HISTORY: MRI of brain and C-spine  are without pathology   Active Problems:   Weakness   Leg weakness, bilateral  Generalized weakness   Myalgia   Rash   Neck pain    Rebecca Hughes is a 30 y.o. female with a curious history of onset of a vesicular rash on her legs that spread to her torso and arms then accompanied by severe neck pain and stiffness of her shoulders and lower extremity weakness.  #1 Weakness:   I am still puzzled by her presentation.  She would appear to have an autoimmune process.  I cannot relate this to a specific infectious disease.  I do not think Epstein-Barr has anything to do with her symptoms Other pertinent infectious these labs that were negative included an RPR HIV fourth-generation assay hepatitis panel respiratory virus panel, negative blood cultures.  MRI of the brain and C-spine that I ordered yesterday are again without pathology.  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  I did order the full EBV panel as well as an EBV DNA quantitative assay from  the blood  Dr. Luciana Axe will take over the service tomorrow.   LOS: 3 days   Acey Lav 03/10/2021, 3:23 PM

## 2021-03-11 ENCOUNTER — Inpatient Hospital Stay (HOSPITAL_COMMUNITY): Payer: BLUE CROSS/BLUE SHIELD

## 2021-03-11 DIAGNOSIS — R29898 Other symptoms and signs involving the musculoskeletal system: Secondary | ICD-10-CM | POA: Diagnosis not present

## 2021-03-11 DIAGNOSIS — M791 Myalgia, unspecified site: Secondary | ICD-10-CM | POA: Diagnosis not present

## 2021-03-11 DIAGNOSIS — R531 Weakness: Secondary | ICD-10-CM | POA: Diagnosis not present

## 2021-03-11 DIAGNOSIS — R5383 Other fatigue: Secondary | ICD-10-CM | POA: Diagnosis not present

## 2021-03-11 LAB — EPSTEIN-BARR VIRUS (EBV) ANTIBODY PROFILE
EBV NA IgG: >600 U/mL — ABNORMAL HIGH (ref 0.0–17.9)
EBV VCA IgG: 243 U/mL — ABNORMAL HIGH (ref 0.0–17.9)
EBV VCA IgM: 127 U/mL — ABNORMAL HIGH (ref 0.0–35.9)

## 2021-03-11 LAB — RHEUMATOID FACTOR: Rheumatoid fact SerPl-aCnc: <10 IU/mL (ref <14.0)

## 2021-03-11 LAB — HISTOPLASMA ANTIGEN, URINE: Histoplasma Antigen, urine: <0.5 (ref <0.5)

## 2021-03-11 LAB — PARVOVIRUS B19 ANTIBODY, IGG AND IGM
Parovirus B19 IgG Abs: 8.2 index — ABNORMAL HIGH (ref 0.0–0.8)
Parovirus B19 IgM Abs: 0.5 index (ref 0.0–0.8)

## 2021-03-11 MED ORDER — GADOBUTROL 1 MMOL/ML IV SOLN
5.0000 mL | Freq: Once | INTRAVENOUS | Status: AC | PRN
  Administered 2021-03-11: 5 mL via INTRAVENOUS

## 2021-03-11 NOTE — Progress Notes (Signed)
Physical Therapy Treatment Patient Details Name: Rebecca Hughes MRN: [id number] DOB: 1991/03/20 Today's Date: 03/11/2021   History of Present Illness Pt is 30 yo female with no PMH who presented on 03/05/21 with fevers, chills, arthralgias and LE weakness.  About a month ago, developed a rash on legs that appeared like bug bites, then formed vesicles, popped, and scabed over.  Then LE weakness began Monday 10/24.  Unclear etiology at this point - possibly viral illness ad now reactive arthritis, has ID consulted.    PT Comments    Pt ambulated 69' with RW, no loss of balance, but with decreased step length, improved foot clearance from last session but pt reports continued difficulty with lifting her legs to advance them. She reports difficulty with active forward neck flexion and has noted ~50% decrease in AROM with neck flexion. Performed gentle joint mobilization (anterior glides) for C3-C7 , gentle manual cervical traction, gentle soft tissue mobilization to cervical paraspinals, and applied hot pack to posterior neck. Instructed pt in BLE strengthening exercises.    Recommendations for follow up therapy are one component of a multi-disciplinary discharge planning process, led by the attending physician.  Recommendations may be updated based on patient status, additional functional criteria and insurance authorization.  Follow Up Recommendations  Acute inpatient rehab (3hours/day)     Assistance Recommended at Discharge Intermittent Supervision/Assistance  Equipment Recommendations  Rolling walker (2 wheels)    Recommendations for Other Services Rehab consult     Precautions / Restrictions Precautions Precautions: Fall Restrictions Weight Bearing Restrictions: No     Mobility  Bed Mobility Overal bed mobility: Modified Independent Bed Mobility: Supine to Sit;Sit to Supine     Supine to sit: HOB elevated;Modified independent (Device/Increase time) Sit to supine: Modified  independent (Device/Increase time)   General bed mobility comments: increased time but no physical assist    Transfers Overall transfer level: Modified independent Equipment used: Rolling walker (2 wheels) Transfers: Sit to/from Stand Sit to Stand: Modified independent (Device/Increase time)           General transfer comment: increased time but no physical assist needed    Ambulation/Gait Ambulation/Gait assistance: Supervision Gait Distance (Feet): 80 Feet Assistive device: Rolling walker (2 wheels) Gait Pattern/deviations: Decreased stride length;Step-through pattern Gait velocity: decreased   General Gait Details: improved foot clearance but pt reports its still difficult to lift her legs to advance them when walking, R more difficult than left   Stairs             Wheelchair Mobility    Modified Rankin (Stroke Patients Only)       Balance Overall balance assessment: Needs assistance Sitting-balance support: No upper extremity supported Sitting balance-Leahy Scale: Good     Standing balance support: Reliant on assistive device for balance;Bilateral upper extremity supported Standing balance-Leahy Scale: Fair Standing balance comment: requiring RW for walking, fair static                            Cognition Arousal/Alertness: Awake/alert Behavior During Therapy: WFL for tasks assessed/performed Overall Cognitive Status: Within Functional Limits for tasks assessed                                 General Comments: 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        Exercises General Exercises - Lower Extremity Ankle Circles/Pumps: AROM;Both;10  reps;Seated Quad Sets: AROM;Both;5 reps;Supine Long Arc Quad: AROM;Both;5 reps;Seated Hip Flexion/Marching: AROM;Both;5 reps;Seated Other Exercises Other Exercises: Joint mobilization to cervical spine, anterior glides C3-C7 x 10 each Other Exercises:  gentle manual cervical traction    General Comments        Pertinent Vitals/Pain Pain Score: 3  Pain Location: posterior neck with forward neck flexion, and B knees after walking Pain Descriptors / Indicators: Aching;Sore Pain Intervention(s): Limited activity within patient's tolerance;Monitored during session;Repositioned;Heat applied    Home Living                          Prior Function            PT Goals (current goals can now be found in the care plan section) Acute Rehab PT Goals Patient Stated Goal: return to normal PT Goal Formulation: With patient/family Time For Goal Achievement: 03/23/21 Potential to Achieve Goals: Good Additional Goals Additional Goal #1: LE strength to 5/5 for improved stairs and gait Progress towards PT goals: Progressing toward goals    Frequency    Min 3X/week      PT Plan Current plan remains appropriate    Co-evaluation              AM-PAC PT "6 Clicks" Mobility   Outcome Measure  Help needed turning from your back to your side while in a flat bed without using bedrails?: A Little Help needed moving from lying on your back to sitting on the side of a flat bed without using bedrails?: A Little Help needed moving to and from a bed to a chair (including a wheelchair)?: A Little Help needed standing up from a chair using your arms (e.g., wheelchair or bedside chair)?: A Little Help needed to walk in hospital room?: A Little Help needed climbing 3-5 steps with a railing? : A Lot 6 Click Score: 17    End of Session Equipment Utilized During Treatment: Gait belt Activity Tolerance: Patient tolerated treatment well Patient left: in bed;with call bell/phone within reach;with family/visitor present Nurse Communication: Mobility status PT Visit Diagnosis: Other abnormalities of gait and mobility (R26.89);Muscle weakness (generalized) (M62.81);Pain Pain - Right/Left: Right Pain - part of body: Knee     Time:  2778-2423 PT Time Calculation (min) (ACUTE ONLY): 26 min  Charges:  $Gait Training: 8-22 mins $Therapeutic Exercise: 8-22 mins                     Ralene Bathe Kistler PT 03/11/2021  Acute Rehabilitation Services Pager [phone] Office [phone]

## 2021-03-11 NOTE — Progress Notes (Signed)
Inpatient Rehab Admissions Coordinator:   Per therapy recommendations, patient was screened for CIR candidacy by Megan Salon, MS, CCC-SLP  . At this time, Pt. is transferring at mod I and ambulating at supervision level.  Pt. Does not demonstrate functional deficits significant to warrant admission to acute inpatient rehab. Recommend pursuing other venues for rehab if Pt. Desires.  Please contact me with any questions.   Megan Salon, MS, CCC-SLP Rehab Admissions Coordinator  [phone] (celll) [phone] (office)

## 2021-03-11 NOTE — Progress Notes (Signed)
[facility]   Reason for visit: Follow up on polyarthralgias and myalgias  Interval History: some improvement in ambulation,a ble to get to bathroom.  No fever. No new complaints other than some expected soreness from increased activity.     Physical Exam: Constitutional:  Vitals:   03/11/21 0621 03/11/21 1404  BP: 103/62 102/62  Pulse: 62 73  Resp: 16 18  Temp: 98.1 F (36.7 C) 98.3 F (36.8 C)  SpO2: 99% 98%   patient appears in NAD Respiratory: Normal respiratory effort; CTA B Skin: rashes diminishing, no new areas  Review of Systems: Constitutional: negative for fevers and chills Eyes: negative for visual disturbance Neurological: negative for headaches  Lab Results  Component Value Date   WBC 9.4 03/09/2021   HGB 12.2 03/09/2021   HCT 36.5 03/09/2021   MCV 89.2 03/09/2021   PLT 370 03/09/2021    Lab Results  Component Value Date   CREATININE 0.53 03/09/2021   BUN 15 03/09/2021   NA 135 03/09/2021   K 3.9 03/09/2021   CL 100 03/09/2021   CO2 25 03/09/2021    Lab Results  Component Value Date   ALT 9 03/07/2021   AST 15 03/07/2021   ALKPHOS 38 03/07/2021     Microbiology: Recent Results (from the past 240 hour(s))  Resp Panel by RT-PCR (Flu A&B, Covid) Nasopharyngeal Swab     Status: None   Collection Time: 03/05/21  2:14 PM   Specimen: Nasopharyngeal Swab; Nasopharyngeal(NP) swabs in vial transport medium  Result Value Ref Range Status   SARS Coronavirus 2 by RT PCR NEGATIVE NEGATIVE Final    Comment: (NOTE) SARS-CoV-2 target nucleic acids are NOT DETECTED.  The SARS-CoV-2 RNA is generally detectable in upper respiratory specimens during the acute phase of infection. The lowest concentration of SARS-CoV-2 viral copies this assay can detect is 138 copies/mL. A negative result does not preclude SARS-Cov-2 infection and should not be used as the sole basis for treatment or other patient management decisions. A negative  result may occur with  improper specimen collection/handling, submission of specimen other than nasopharyngeal swab, presence of viral mutation(s) within the areas targeted by this assay, and inadequate number of viral copies(<138 copies/mL). A negative result must be combined with clinical observations, patient history, and epidemiological information. The expected result is Negative.  Fact Sheet for Patients:  BloggerCourse.comhttps://www.fda.gov/media/152166/download  Fact Sheet for Healthcare Providers:  SeriousBroker.ithttps://www.fda.gov/media/152162/download  This test is no t yet approved or cleared by the [place] FDA and  has been authorized for detection and/or diagnosis of SARS-CoV-2 by FDA under an Emergency Use Authorization (EUA). This EUA will remain  in effect (meaning this test can be used) for the duration of the COVID-19 declaration under Section 564(b)(1) of the Act, 21 U.S.C.section 360bbb-3(b)(1), unless the authorization is terminated  or revoked sooner.       Influenza A by PCR NEGATIVE NEGATIVE Final   Influenza B by PCR NEGATIVE NEGATIVE Final    Comment: (NOTE) The Xpert Xpress SARS-CoV-2/FLU/RSV plus assay is intended as an aid in the diagnosis of influenza from Nasopharyngeal swab specimens and should not be used as a sole basis for treatment. Nasal washings and aspirates are unacceptable for Xpert Xpress SARS-CoV-2/FLU/RSV testing.  Fact Sheet for Patients: BloggerCourse.comhttps://www.fda.gov/media/152166/download  Fact Sheet for Healthcare Providers: SeriousBroker.ithttps://www.fda.gov/media/152162/download  This test is not yet approved or cleared by the [place] FDA and has been authorized for detection and/or diagnosis of SARS-CoV-2 by FDA under an  Emergency Use Authorization (EUA). This EUA will remain in effect (meaning this test can be used) for the duration of the COVID-19 declaration under Section 564(b)(1) of the Act, 21 U.S.C. section 360bbb-3(b)(1), unless the authorization is  terminated or revoked.  Performed at [facility], [address], [place], [place] [address]   Blood culture (routine x 2)     Status: None   Collection Time: 03/05/21  2:20 PM   Specimen: BLOOD RIGHT ARM  Result Value Ref Range Status   Specimen Description   Final    BLOOD RIGHT ARM Performed at [facility], [address] [place], [place] [address]    Special Requests   Final    BOTTLES DRAWN AEROBIC AND ANAEROBIC Blood Culture adequate volume Performed at [facility], [address] [place], [place] [address]    Culture   Final    NO GROWTH 5 DAYS Performed at [facility], [address] [place], [place] [address]    Report Status 03/10/2021 FINAL  Final  Blood culture (routine x 2)     Status: None   Collection Time: 03/05/21  2:40 PM   Specimen: BLOOD  Result Value Ref Range Status   Specimen Description BLOOD LEFT ANTECUBITAL  Final   Special Requests   Final    BOTTLES DRAWN AEROBIC AND ANAEROBIC Blood Culture adequate volume   Culture   Final    NO GROWTH 5 DAYS Performed at [facility], [address] [place], [place] [address]    Report Status 03/10/2021 FINAL  Final    Impression/Plan:  1. Polyarthralgia - no etiology identified.  Multiple negative tests.  Remains on steroids.  No obvious infectious concern and I have recommended continuing with steroids and follow up with rheumatology as an outpatient.   Lumbar MRI ordered.  Will follow.    2.  Rash - improved overall.    3.  Fever - she remains afebrile during this admission and will continue to monitor.

## 2021-03-11 NOTE — Progress Notes (Signed)
PROGRESS NOTE  Rebecca Hughes YQM:[id number] DOB: 1990/06/02 DOA: 03/05/2021 PCP: Pcp, No   LOS: 4 days   Brief Narrative / Interim history: 30 year old female with no significant past medical history comes into the hospital with fevers, chills, arthralgias as well as lower extremity weakness.  About a month ago, while traveling for business, has noticed a rash mainly located on her legs, initially looking like bug bites but then progressed to form vesicles with a clear liquid, eventually popped and started to scab over.  So few lesions on her back, thighs, arms but mostly were located on the legs.  She is also been complaining of stiffness in her shoulders, neck, having difficulties rotating her neck.  Has been having swelling in her knees, pain, as well as progressive weakness to the point that she was unable to walk and came to the hospital.  Subjective / 24h Interval events: Feels better.  Can lift her legs up more  Assessment & Plan: Principal Problem Generalized lower extremity weakness, polyarthralgia, rash-unclear etiology at this point, possibly had a viral illness and now has reactive arthritis.  ID and neurology consulted. -HIV negative.  CMV IgM negative, RPR negative, ANA negative, ANCA panel negative, TSH normal, LDH normal, ferritin normal, coccidioides Immitis IgM and IgG negative -Histoplasma antigen pending, , Parvovirus B19 pending, rheumatoid factor pending, full EBV panel pending -vitamin B12 normal but on the low side, supplement -Obtain PT evaluated as well -Continue steroids -EBV VCA IgM and IgG elevated, suggesting recent infection versus reactivation discussed with ID, not sure whether is related to her current condition -Brain and C-spine MRI negative, obtain L-spine MRI  Active problems Hypokalemia-repleted  Scheduled Meds:  cyanocobalamin  1,000 mcg Intramuscular Daily   enoxaparin (LOVENOX) injection  40 mg Subcutaneous Q24H   methylPREDNISolone (SOLU-MEDROL)  injection  40 mg Intravenous Q12H   multivitamin with minerals  1 tablet Oral Daily   Continuous Infusions: PRN Meds:.ibuprofen, ondansetron (ZOFRAN) IV  Diet Orders (From admission, onward)     Start     Ordered   03/06/21 1828  Diet regular Room service appropriate? Yes; Fluid consistency: Thin  Diet effective now       Question Answer Comment  Room service appropriate? Yes   Fluid consistency: Thin      03/06/21 1827            DVT prophylaxis: enoxaparin (LOVENOX) injection 40 mg Start: 03/06/21 2200     Code Status: Full Code  Family Communication: No family at bedside  Status is: Inpatient   Level of care: Med-Surg  Consultants:  Neurology ID  Procedures:  none  Microbiology  none  Antimicrobials: none    Objective: Vitals:   03/10/21 0556 03/10/21 1413 03/10/21 2108 03/11/21 0621  BP: 101/63 112/71 (!) 92/59 103/62  Pulse: 63 71 60 62  Resp: 18 18 18 16   Temp: (!) 97.5 F (36.4 C) 98.2 F (36.8 C) 97.8 F (36.6 C) 98.1 F (36.7 C)  TempSrc: Oral Oral Oral Oral  SpO2: 99% 97% 95% 99%  Weight:      Height:        Intake/Output Summary (Last 24 hours) at 03/11/2021 0949 Last data filed at 03/11/2021 0600 Gross per 24 hour  Intake 600 ml  Output --  Net 600 ml    Filed Weights   03/05/21 1028 03/06/21 1612  Weight: 50.3 kg 50.3 kg    Examination:  Constitutional: No distress, in bed Eyes: Anicteric ENMT: mmm Neck: normal, supple Respiratory:  Clear to auscultation Cardiovascular: Heart is regular, no murmurs Abdomen: No distention Musculoskeletal: no clubbing / cyanosis.  Skin: Faint rash on the lower extremities, healing small subcentimeter lesions, no new rashes Neurologic: 4/5 lower extremities, equal, otherwise unremarkable, no new focal deficits  Data Reviewed: I have independently reviewed following labs and imaging studies  CBC: Recent Labs  Lab 03/05/21 1052 03/06/21 0739 03/07/21 0421 03/09/21 0427  WBC 7.2  7.2 9.4 9.4  NEUTROABS  --  6.6  --   --   HGB 12.8 12.1 11.3* 12.2  HCT 38.6 35.8* 34.4* 36.5  MCV 88.5 88.4 90.3 89.2  PLT 291 310 280 370    Basic Metabolic Panel: Recent Labs  Lab 03/05/21 1052 03/06/21 0739 03/07/21 0421 03/09/21 0427  NA 136 139 139 135  K 3.6 3.8 3.3* 3.9  CL 102 106 107 100  CO2 25 22 27 25   GLUCOSE 92 109* 103* 135*  BUN 6 7 13 15   CREATININE 0.49 0.50 0.56 0.53  CALCIUM 9.3 8.4* 8.7* 9.0    Liver Function Tests: Recent Labs  Lab 03/05/21 1427 03/06/21 0739 03/07/21 0421  AST 16 13* 15  ALT 7 6 9   ALKPHOS 52 42 38  BILITOT 1.0 0.6 0.8  PROT 8.6* 7.1 6.7  ALBUMIN 4.4 3.7 3.4*    Coagulation Profile: No results for input(s): INR, PROTIME in the last 168 hours. HbA1C: No results for input(s): HGBA1C in the last 72 hours. CBG: No results for input(s): GLUCAP in the last 168 hours.  Recent Results (from the past 240 hour(s))  Resp Panel by RT-PCR (Flu A&B, Covid) Nasopharyngeal Swab     Status: None   Collection Time: 03/05/21  2:14 PM   Specimen: Nasopharyngeal Swab; Nasopharyngeal(NP) swabs in vial transport medium  Result Value Ref Range Status   SARS Coronavirus 2 by RT PCR NEGATIVE NEGATIVE Final    Comment: (NOTE) SARS-CoV-2 target nucleic acids are NOT DETECTED.  The SARS-CoV-2 RNA is generally detectable in upper respiratory specimens during the acute phase of infection. The lowest concentration of SARS-CoV-2 viral copies this assay can detect is 138 copies/mL. A negative result does not preclude SARS-Cov-2 infection and should not be used as the sole basis for treatment or other patient management decisions. A negative result may occur with  improper specimen collection/handling, submission of specimen other than nasopharyngeal swab, presence of viral mutation(s) within the areas targeted by this assay, and inadequate number of viral copies(<138 copies/mL). A negative result must be combined with clinical observations,  patient history, and epidemiological information. The expected result is Negative.  Fact Sheet for Patients:  03/09/21  Fact Sheet for Healthcare Providers:   This test is no t yet approved or cleared by the 03/07/21 FDA and  has been authorized for detection and/or diagnosis of SARS-CoV-2 by FDA under an Emergency Use Authorization (EUA). This EUA will remain  in effect (meaning this test can be used) for the duration of the COVID-19 declaration under Section 564(b)(1) of the Act, 21 U.S.C.section 360bbb-3(b)(1), unless the authorization is terminated  or revoked sooner.       Influenza A by PCR NEGATIVE NEGATIVE Final   Influenza B by PCR NEGATIVE NEGATIVE Final    Comment: (NOTE) The Xpert Xpress SARS-CoV-2/FLU/RSV plus assay is intended as an aid in the diagnosis of influenza from Nasopharyngeal swab specimens and should not be used as a sole basis for treatment. Nasal washings and aspirates are unacceptable for Xpert Xpress SARS-CoV-2/FLU/RSV testing.  Fact Sheet for Patients: BloggerCourse.com  Fact Sheet for Healthcare Providers: SeriousBroker.it  This test is not yet approved or cleared by the [place] FDA and has been authorized for detection and/or diagnosis of SARS-CoV-2 by FDA under an Emergency Use Authorization (EUA). This EUA will remain in effect (meaning this test can be used) for the duration of the COVID-19 declaration under Section 564(b)(1) of the Act, 21 U.S.C. section 360bbb-3(b)(1), unless the authorization is terminated or revoked.  Performed at [facility], [address] [place], [place] [address]   Blood culture (routine x 2)     Status: None   Collection Time: 03/05/21  2:20 PM   Specimen: BLOOD RIGHT ARM  Result Value Ref Range Status   Specimen Description   Final    BLOOD RIGHT  ARM Performed at [facility], [address], [place], [place] [address]    Special Requests   Final    BOTTLES DRAWN AEROBIC AND ANAEROBIC Blood Culture adequate volume Performed at [facility], [address] [place], [place] [address]    Culture   Final    NO GROWTH 5 DAYS Performed at [facility], [address], [place], [place] [address]    Report Status 03/10/2021 FINAL  Final  Blood culture (routine x 2)     Status: None   Collection Time: 03/05/21  2:40 PM   Specimen: BLOOD  Result Value Ref Range Status   Specimen Description BLOOD LEFT ANTECUBITAL  Final   Special Requests   Final    BOTTLES DRAWN AEROBIC AND ANAEROBIC Blood Culture adequate volume   Culture   Final    NO GROWTH 5 DAYS Performed at [facility], [address], [place], [place] [address]    Report Status 03/10/2021 FINAL  Final      Radiology Studies: MR BRAIN W WO CONTRAST  Result Date: 03/10/2021 CLINICAL DATA:  Neuro deficit, stroke suspected. EXAM: MRI HEAD WITHOUT AND WITH CONTRAST TECHNIQUE: Multiplanar, multiecho pulse sequences of the brain and surrounding structures were obtained without and with intravenous contrast. CONTRAST:  62mL GADAVIST GADOBUTROL 1 MMOL/ML IV SOLN COMPARISON:  None. FINDINGS: Brain: No acute infarct, hemorrhage, or mass lesion is present. No significant white matter lesions are present. The ventricles are of normal size. No significant extraaxial fluid collection is present. The internal auditory canals are within normal limits. The brainstem and cerebellum are within normal limits. Postcontrast images demonstrate no pathologic enhancement. Vascular: Flow is present in the major intracranial arteries. Skull and upper cervical spine: The craniocervical junction is normal. Upper cervical spine is within normal limits. Marrow signal is unremarkable. Sinuses/Orbits: The paranasal sinuses and mastoid air cells are clear. The  globes and orbits are within normal limits. IMPRESSION: Negative MRI of the brain without and with contrast. No acute or focal etiology for the patient's lower extremity weakness. Electronically Signed   By: Marin Roberts M.D.   On: 03/10/2021 12:35   MR CERVICAL SPINE W WO CONTRAST  Result Date: 03/10/2021 CLINICAL DATA:  Epidural abscess EXAM: MRI CERVICAL SPINE WITHOUT AND WITH CONTRAST TECHNIQUE: Multiplanar and multiecho pulse sequences of the cervical spine, to include the craniocervical junction and cervicothoracic junction, were obtained without and with intravenous contrast. CONTRAST:  54mL GADAVIST GADOBUTROL 1 MMOL/ML IV SOLN COMPARISON:  None. FINDINGS: Alignment: Physiologic. Vertebrae: No acute fracture, evidence of discitis, or bone lesion. Cord: Normal signal and morphology. Posterior Fossa, vertebral arteries, paraspinal tissues: Posterior fossa demonstrates no  focal abnormality. Vertebral artery flow voids are maintained. Paraspinal soft tissues are unremarkable. Disc levels: Discs: Disc spaces are maintained. C2-3: No significant disc bulge. No neural foraminal stenosis. No central canal stenosis. C3-4: No significant disc bulge. No neural foraminal stenosis. No central canal stenosis. C4-5: No significant disc bulge. No neural foraminal stenosis. No central canal stenosis. C5-6: No significant disc bulge. No neural foraminal stenosis. No central canal stenosis. C6-7: No significant disc bulge. No neural foraminal stenosis. No central canal stenosis. C7-T1: No significant disc bulge. No neural foraminal stenosis. No central canal stenosis. IMPRESSION: 1. Normal MRI of the cervical spine. 2. No evidence of a cervical epidural abscess. Electronically Signed   By: Elige Ko M.D.   On: 03/10/2021 12:18     Pamella Pert, MD, PhD [facility]  Between 7 am - 7 pm I am available, please contact me via Amion (for emergencies) or Securechat (non urgent messages)  Between 7 pm  - 7 am I am not available, please contact night coverage MD/APP via Amion

## 2021-03-12 ENCOUNTER — Encounter (HOSPITAL_COMMUNITY): Payer: Self-pay | Admitting: Internal Medicine

## 2021-03-12 MED ORDER — ADULT MULTIVITAMIN W/MINERALS CH: 1.0000 | ORAL_TABLET | Freq: Every day | ORAL | 0 refills | Status: AC

## 2021-03-12 MED ORDER — PREDNISONE 10 MG PO TABS: ORAL_TABLET | ORAL | 0 refills | Status: AC

## 2021-03-12 NOTE — Progress Notes (Signed)
Nurse reviewed discharge instructions with pt.  Pt verbalized understanding of discharge instructions, follow up appointments and new medications.  Rolling walker delivered to pt prior to discharge.

## 2021-03-12 NOTE — Progress Notes (Signed)
Occupational Therapy Treatment Patient Details Name: Rebecca Hughes MRN: [id number] DOB: [birth year] Today's Date: 03/12/2021   History of present illness Pt is 30 yo female with no PMH who presented on 03/05/21 with fevers, chills, arthralgias and LE weakness.  About a month ago, developed a rash on legs that appeared like bug bites, then formed vesicles, popped, and scabed over.  Then LE weakness began Monday 10/24.  Unclear etiology at this point - possibly viral illness ad now reactive arthritis, has ID consulted.   OT comments  Patient was noted to have improved ability to lift BLE off floor to participate in tasks. Patient was excited over ability to engage in tasks. Patient was educated on ECT strategies for home while strength continues to build. Patient was provided with ECT hand outs. Patient plans to transition home with husband and 24/7 support with [facility] services on this date. OT will continue to follow acutely.    Recommendations for follow up therapy are one component of a multi-disciplinary discharge planning process, led by the attending physician.  Recommendations may be updated based on patient status, additional functional criteria and insurance authorization.    Follow Up Recommendations  Home health OT    Assistance Recommended at Discharge Frequent or constant Supervision/Assistance  Equipment Recommendations  Other (comment) (RW, reacher)    Recommendations for Other Services      Precautions / Restrictions Precautions Precautions: Fall Restrictions Weight Bearing Restrictions: No       Mobility Bed Mobility Overal bed mobility: Modified Independent                  Transfers Overall transfer level: Modified independent                       Balance Overall balance assessment: Needs assistance Sitting-balance support: No upper extremity supported Sitting balance-Leahy Scale: Good       Standing balance-Leahy Scale: Fair                              ADL either performed or assessed with clinical judgement   ADL Overall ADL's : Needs assistance/impaired                     Lower Body Dressing: Sit to/from stand;Set up Lower Body Dressing Details (indicate cue type and reason): patient is able to stand with MI with set up of materials with increased time. patient donned socks with MI sitting on edge of bed. patient reported pain 1/10 at rest and 3/10 with activity.               General ADL Comments: patient reported concerns over steps to enter house. patient was able to participate in functional mobiltiy challange from room to gym with increased time using RW. patient was educated on energy conservation strategies to use to conserve energy for challanging tasks. patient verbalized understanding. patient participated in climbing taller steps on stairs in gym with education on going up with good and down with bad. patient was able to teach back this later in session. patients reported 3/10 pain in R hip and neck after functional mobility challange. patient was educated on having chair to rest between flights of steps and having someone home 24/7 patient verbalized understanding and reported husband would be home 24/7.     Vision       Perception     Praxis  Cognition Arousal/Alertness: Awake/alert Behavior During Therapy: WFL for tasks assessed/performed Overall Cognitive Status: Within Functional Limits for tasks assessed                                            Exercises     Shoulder Instructions       General Comments      Pertinent Vitals/ Pain       Pain Score: 3  Pain Location: posterior neck with forward neck flexion,  R hip Pain Descriptors / Indicators: Aching;Sore Pain Intervention(s): Limited activity within patient's tolerance;Monitored during session  Home Living                                          Prior  Functioning/Environment              Frequency           Progress Toward Goals  OT Goals(current goals can now be found in the care plan section)  Progress towards OT goals: Progressing toward goals     Plan Discharge plan needs to be updated    Co-evaluation                 AM-PAC OT "6 Clicks" Daily Activity     Outcome Measure   Help from another person eating meals?: None Help from another person taking care of personal grooming?: None Help from another person toileting, which includes using toliet, bedpan, or urinal?: A Little Help from another person bathing (including washing, rinsing, drying)?: A Lot Help from another person to put on and taking off regular upper body clothing?: A Little Help from another person to put on and taking off regular lower body clothing?: A Little 6 Click Score: 19    End of Session Equipment Utilized During Treatment: Rolling walker (2 wheels)  OT Visit Diagnosis: Unsteadiness on feet (R26.81);Other abnormalities of gait and mobility (R26.89);Muscle weakness (generalized) (M62.81)   Activity Tolerance Patient limited by fatigue   Patient Left in bed;with call bell/phone within reach   Nurse Communication Other (comment) (cleared patient to participate)        Time: 4034-7425 OT Time Calculation (min): 29 min  Charges: OT General Charges $OT Visit: 1 Visit OT Treatments $Self Care/Home Management : 8-22 mins $Therapeutic Activity: 8-22 mins  Sharyn Blitz OTR/L, MS Acute Rehabilitation Department Office# [phone] Pager# [phone]   Rebecca Hughes 03/12/2021, 11:43 AM

## 2021-03-12 NOTE — TOC Transition Note (Signed)
Transition of Care [facility]) - CM/SW Discharge Note   Patient Details  Name: Rebecca Hughes MRN: [id number] Date of Birth: 03/29/1991  [facility] CM/SW Contact:  Lennart Pall, LCSW Phone Number: 03/12/2021, 11:26 AM   Clinical Narrative:    Met with pt to review dc recommendations of HHPT and rw.  Pt agreeable and eager for dc home with spouse today. No agency preferences.  HHPT arranged with [facility].  RW ordered via [employer] for delivery to pt's room prior to surgery.  No further TOC needs.   Final next level of care: [place] Barriers to Discharge: No Barriers Identified   Patient Goals and CMS Choice Patient states their goals for this hospitalization and ongoing recovery are:: return home      Discharge Placement                       Discharge Plan and Services                DME Arranged: Walker rolling DME Agency: [employer] Date DME Agency Contacted: 03/12/21 Time DME Agency Contacted: [id number] Representative spoke with at DME Agency: [place]: PT [place]: [place] Date [place]: 03/12/21 Time [place]: 1126 Representative spoke with at [place]: [place] ([place]) Interventions     Readmission Risk Interventions No flowsheet data found.

## 2021-03-12 NOTE — Discharge Summary (Signed)
Physician Discharge Summary  Kaylon Laroche [facility]:[id number] DOB: 28-Jul-1990 DOA: 03/05/2021  PCP: Pcp, No  Admit date: 03/05/2021 Discharge date: 03/12/2021  Admitted From: home Disposition:  home  Recommendations for Outpatient Follow-up:  Follow up with PCP in 1-2 weeks Follow up with Rheumatology (in town vs [facility]) within 1-2 weeks  Home Health: PT Equipment/Devices: walker  Discharge Condition: stable CODE STATUS: Full code Diet recommendation: regular  HPI: Per admitting MD, Maripat Borba is a 30 y.o. female with no significant medical history. Presenting with fevers, chills, arthralgias. Her symptoms started a month ago. She was traveling for business. After she got home, she noticed what appeared to be bite marks running from her feet to her thighs. She eventually had some on her trunk. She saw a dermatologist and was treated for bed bugs. This did not seem to improved her symptoms. Her bites blistered and ruptured. She reports that her husband did not have any symptoms and was in direct exposure to her ruptures. She reports that approximately a week ago, she felt chills and neck pain. She felt stiff in the shoulders the next day. She found it difficult to rotate her neck over her shoulders. She began to feel aches in her knees and fingers. Several days later, she felt tingling in her fingers and found it difficult to get around without assistance. She became concerned and went to urgent care. They recommended that she come to the ED. She denies any other aggravating or alleviating factors.    Hospital Course / Discharge diagnoses: Principal Problem Generalized lower extremity weakness, polyarthralgia, rash-unclear etiology at this point, possibly had a viral illness and now has reactive arthritis.  ID and neurology consulted. Extensive workup overall has been unrevealing.  Due to elevated CRP and sed rate and reports of bilateral hand, knees, shoulder joint pain she was placed  on steroids.  Her condition overall is starting to improve, regain her full strength in upper body and started to regain strength in the lower body as well.  She is able to ambulate with a walker.  Physical therapy worked with patient while hospitalized.  With the patient improving, will continue steroids on discharge, provide a taper to maintain patient on less than 21 days of steroids and recommended outpatient follow-up with rheumatology.  HIV negative.  CMV IgM negative, RPR negative, ANA negative, ANCA panel negative, TSH normal, LDH normal, ferritin normal, coccidioides Immitis IgM and IgG negative, LDH, ferritin, RA latex negative. Vitamin B12 normal but on the low side, supplement. Histoplasma antigen negative, Parvovirus B19 borderline.  Rheumatoid factor negative. EBV VCA IgM and IgG elevated, suggesting recent infection versus reactivation, not sure whether is related to her current condition. Brain, C-spine and L spine MRI negative   Active problems Hypokalemia-repleted  Sepsis ruled out   Discharge Instructions   Allergies as of 03/12/2021   No Known Allergies      Medication List     STOP taking these medications    doxycycline 100 MG capsule Commonly known as: VIBRAMYCIN   OVER THE COUNTER MEDICATION       TAKE these medications    acetaminophen 500 MG tablet Commonly known as: TYLENOL Take 500 mg by mouth every 6 (six) hours as needed.   ibuprofen 200 MG tablet Commonly known as: ADVIL Take 200 mg by mouth every 6 (six) hours as needed.   multivitamin with minerals Tabs tablet Take 1 tablet by mouth daily.   predniSONE 10 MG tablet Commonly known as:  DELTASONE Take 4 tablets (40 mg total) by mouth daily for 2 days, THEN 3 tablets (30 mg total) daily for 2 days, THEN 2 tablets (20 mg total) daily for 2 days, THEN 1 tablet (10 mg total) daily for 3 days. Start taking on: March 12, 2021        Follow-up Information     Rice, Resa Miner, MD Follow  up.   Specialty: Rheumatology Contact information: [address] [place] [address] [phone]                 Consultations: ID Neurology   Procedures/Studies:  MR BRAIN W WO CONTRAST  Result Date: 03/10/2021 CLINICAL DATA:  Neuro deficit, stroke suspected. EXAM: MRI HEAD WITHOUT AND WITH CONTRAST TECHNIQUE: Multiplanar, multiecho pulse sequences of the brain and surrounding structures were obtained without and with intravenous contrast. CONTRAST:  79mL GADAVIST GADOBUTROL 1 MMOL/ML IV SOLN COMPARISON:  None. FINDINGS: Brain: No acute infarct, hemorrhage, or mass lesion is present. No significant white matter lesions are present. The ventricles are of normal size. No significant extraaxial fluid collection is present. The internal auditory canals are within normal limits. The brainstem and cerebellum are within normal limits. Postcontrast images demonstrate no pathologic enhancement. Vascular: Flow is present in the major intracranial arteries. Skull and upper cervical spine: The craniocervical junction is normal. Upper cervical spine is within normal limits. Marrow signal is unremarkable. Sinuses/Orbits: The paranasal sinuses and mastoid air cells are clear. The globes and orbits are within normal limits. IMPRESSION: Negative MRI of the brain without and with contrast. No acute or focal etiology for the patient's lower extremity weakness. Electronically Signed   By: San Morelle M.D.   On: 03/10/2021 12:35   MR CERVICAL SPINE W WO CONTRAST  Result Date: 03/10/2021 CLINICAL DATA:  Epidural abscess EXAM: MRI CERVICAL SPINE WITHOUT AND WITH CONTRAST TECHNIQUE: Multiplanar and multiecho pulse sequences of the cervical spine, to include the craniocervical junction and cervicothoracic junction, were obtained without and with intravenous contrast. CONTRAST:  38mL GADAVIST GADOBUTROL 1 MMOL/ML IV SOLN COMPARISON:  None. FINDINGS: Alignment: Physiologic. Vertebrae:  No acute fracture, evidence of discitis, or bone lesion. Cord: Normal signal and morphology. Posterior Fossa, vertebral arteries, paraspinal tissues: Posterior fossa demonstrates no focal abnormality. Vertebral artery flow voids are maintained. Paraspinal soft tissues are unremarkable. Disc levels: Discs: Disc spaces are maintained. C2-3: No significant disc bulge. No neural foraminal stenosis. No central canal stenosis. C3-4: No significant disc bulge. No neural foraminal stenosis. No central canal stenosis. C4-5: No significant disc bulge. No neural foraminal stenosis. No central canal stenosis. C5-6: No significant disc bulge. No neural foraminal stenosis. No central canal stenosis. C6-7: No significant disc bulge. No neural foraminal stenosis. No central canal stenosis. C7-T1: No significant disc bulge. No neural foraminal stenosis. No central canal stenosis. IMPRESSION: 1. Normal MRI of the cervical spine. 2. No evidence of a cervical epidural abscess. Electronically Signed   By: Kathreen Devoid M.D.   On: 03/10/2021 12:18   MR Lumbar Spine W Wo Contrast  Result Date: 03/11/2021 CLINICAL DATA:  Low back pain EXAM: MRI LUMBAR SPINE WITHOUT AND WITH CONTRAST TECHNIQUE: Multiplanar and multiecho pulse sequences of the lumbar spine were obtained without and with intravenous contrast. CONTRAST:  4mL GADAVIST GADOBUTROL 1 MMOL/ML IV SOLN COMPARISON:  None. FINDINGS: Segmentation:  Standard Alignment:  Physiologic. Vertebrae:  No fracture, evidence of discitis, or bone lesion. Conus medullaris and cauda equina: Conus extends to the L1 level. Conus and  cauda equina appear normal. Paraspinal and other soft tissues: Negative. Disc levels: No spinal canal or neural foraminal stenosis. IMPRESSION: Normal MRI of the lumbar spine. Electronically Signed   By: Ulyses Jarred M.D.   On: 03/11/2021 21:49   DG Chest Portable 1 View  Result Date: 03/05/2021 CLINICAL DATA:  Fever. EXAM: PORTABLE CHEST 1 VIEW COMPARISON:   None. FINDINGS: The heart size and mediastinal contours are within normal limits. Both lungs are clear. The visualized skeletal structures are unremarkable. IMPRESSION: No active disease. Electronically Signed   By: Ronney Asters M.D.   On: 03/05/2021 17:22   ECHOCARDIOGRAM COMPLETE  Result Date: 03/07/2021    ECHOCARDIOGRAM REPORT   Patient Name:   TERIONA HELLIWELL Date of Exam: 03/07/2021 Medical Rec #:  [id number]     Height:       63.0 in Accession #:    [id number]    Weight:       110.9 lb Date of Birth:  04-25-1991      BSA:          1.505 m Patient Age:    30 years      BP:           112/65 mmHg Patient Gender: F             HR:           72 bpm. Exam Location:  Inpatient Procedure: 2D Echo, Cardiac Doppler and Color Doppler Indications:     Fever  History:         Patient has no prior history of Echocardiogram examinations.  Sonographer:     Bernadene Person RDCS Referring Phys:  Jenkins Diagnosing Phys: Fransico Him MD IMPRESSIONS  1. Left ventricular ejection fraction, by estimation, is 60 to 65%. The left ventricle has normal function. The left ventricle has no regional wall motion abnormalities. Left ventricular diastolic parameters were normal.  2. Right ventricular systolic function is normal. The right ventricular size is normal. Tricuspid regurgitation signal is inadequate for assessing PA pressure.  3. The mitral valve is normal in structure. Trivial mitral valve regurgitation. No evidence of mitral stenosis.  4. The aortic valve is normal in structure. Aortic valve regurgitation is not visualized. No aortic stenosis is present.  5. The inferior vena cava is normal in size with greater than 50% respiratory variability, suggesting right atrial pressure of 3 mmHg. Conclusion(s)/Recommendation(s): No evidence of valvular vegetations on this transthoracic echocardiogram. Would recommend a transesophageal echocardiogram to exclude infective endocarditis if clinically indicated. FINDINGS  Left  Ventricle: Left ventricular ejection fraction, by estimation, is 60 to 65%. The left ventricle has normal function. The left ventricle has no regional wall motion abnormalities. The left ventricular internal cavity size was normal in size. There is  no left ventricular hypertrophy. Left ventricular diastolic parameters were normal. Normal left ventricular filling pressure. Right Ventricle: The right ventricular size is normal. No increase in right ventricular wall thickness. Right ventricular systolic function is normal. Tricuspid regurgitation signal is inadequate for assessing PA pressure. Left Atrium: Left atrial size was normal in size. Right Atrium: Right atrial size was normal in size. Pericardium: There is no evidence of pericardial effusion. Mitral Valve: The mitral valve is normal in structure. Trivial mitral valve regurgitation. No evidence of mitral valve stenosis. Tricuspid Valve: The tricuspid valve is normal in structure. Tricuspid valve regurgitation is trivial. No evidence of tricuspid stenosis. Aortic Valve: The aortic valve is normal in structure. Aortic valve regurgitation  is not visualized. No aortic stenosis is present. Pulmonic Valve: The pulmonic valve was normal in structure. Pulmonic valve regurgitation is not visualized. No evidence of pulmonic stenosis. Aorta: The aortic root is normal in size and structure. Venous: The inferior vena cava is normal in size with greater than 50% respiratory variability, suggesting right atrial pressure of 3 mmHg. IAS/Shunts: No atrial level shunt detected by color flow Doppler.  LEFT VENTRICLE PLAX 2D LVIDd:         3.90 cm   Diastology LVIDs:         2.70 cm   LV e' medial:    11.00 cm/s LV PW:         0.80 cm   LV E/e' medial:  8.8 LV IVS:        0.70 cm   LV e' lateral:   16.20 cm/s LVOT diam:     2.00 cm   LV E/e' lateral: 5.9 LV SV:         70 LV SV Index:   47 LVOT Area:     3.14 cm  RIGHT VENTRICLE RV S prime:     10.50 cm/s TAPSE (M-mode): 2.2 cm  LEFT ATRIUM           Index        RIGHT ATRIUM          Index LA diam:      2.00 cm 1.33 cm/m   RA Area:     7.94 cm LA Vol (A2C): 20.6 ml 13.69 ml/m  RA Volume:   12.50 ml 8.31 ml/m LA Vol (A4C): 19.9 ml 13.22 ml/m  AORTIC VALVE LVOT Vmax:   117.00 cm/s LVOT Vmean:  77.100 cm/s LVOT VTI:    0.224 m  AORTA Ao Root diam: 2.80 cm MITRAL VALVE MV Area (PHT): 3.89 cm    SHUNTS MV Decel Time: 195 msec    Systemic VTI:  0.22 m MV E velocity: 96.30 cm/s  Systemic Diam: 2.00 cm MV A velocity: 55.80 cm/s MV E/A ratio:  1.73 Fransico Him MD Electronically signed by Fransico Him MD Signature Date/Time: 03/07/2021/4:01:57 PM    Final (Updated)      Subjective: - no chest pain, shortness of breath, no abdominal pain, nausea or vomiting.   Discharge Exam: BP 104/66 (BP Location: Left Arm)   Pulse 65   Temp 97.9 F (36.6 C) (Oral)   Resp 18   Ht 5\' 3"  (1.6 m)   Wt 50.3 kg   LMP 02/02/2021 (Approximate)   SpO2 99%   BMI 19.64 kg/m   General: Pt is alert, awake, not in acute distress Extremities: no edema, no cyanosis   The results of significant diagnostics from this hospitalization (including imaging, microbiology, ancillary and laboratory) are listed below for reference.     Microbiology: Recent Results (from the past 240 hour(s))  Resp Panel by RT-PCR (Flu A&B, Covid) Nasopharyngeal Swab     Status: None   Collection Time: 03/05/21  2:14 PM   Specimen: Nasopharyngeal Swab; Nasopharyngeal(NP) swabs in vial transport medium  Result Value Ref Range Status   SARS Coronavirus 2 by RT PCR NEGATIVE NEGATIVE Final    Comment: (NOTE) SARS-CoV-2 target nucleic acids are NOT DETECTED.  The SARS-CoV-2 RNA is generally detectable in upper respiratory specimens during the acute phase of infection. The lowest concentration of SARS-CoV-2 viral copies this assay can detect is 138 copies/mL. A negative result does not preclude SARS-Cov-2 infection and should not be used as  the sole basis for  treatment or other patient management decisions. A negative result may occur with  improper specimen collection/handling, submission of specimen other than nasopharyngeal swab, presence of viral mutation(s) within the areas targeted by this assay, and inadequate number of viral copies(<138 copies/mL). A negative result must be combined with clinical observations, patient history, and epidemiological information. The expected result is Negative.  Fact Sheet for Patients:  EntrepreneurPulse.com.au  Fact Sheet for Healthcare Providers:  IncredibleEmployment.be  This test is no t yet approved or cleared by the [place] FDA and  has been authorized for detection and/or diagnosis of SARS-CoV-2 by FDA under an Emergency Use Authorization (EUA). This EUA will remain  in effect (meaning this test can be used) for the duration of the COVID-19 declaration under Section 564(b)(1) of the Act, 21 U.S.C.section 360bbb-3(b)(1), unless the authorization is terminated  or revoked sooner.       Influenza A by PCR NEGATIVE NEGATIVE Final   Influenza B by PCR NEGATIVE NEGATIVE Final    Comment: (NOTE) The Xpert Xpress SARS-CoV-2/FLU/RSV plus assay is intended as an aid in the diagnosis of influenza from Nasopharyngeal swab specimens and should not be used as a sole basis for treatment. Nasal washings and aspirates are unacceptable for Xpert Xpress SARS-CoV-2/FLU/RSV testing.  Fact Sheet for Patients: EntrepreneurPulse.com.au  Fact Sheet for Healthcare Providers: IncredibleEmployment.be  This test is not yet approved or cleared by the [place] FDA and has been authorized for detection and/or diagnosis of SARS-CoV-2 by FDA under an Emergency Use Authorization (EUA). This EUA will remain in effect (meaning this test can be used) for the duration of the COVID-19 declaration under Section 564(b)(1) of the Act, 21  U.S.C. section 360bbb-3(b)(1), unless the authorization is terminated or revoked.  Performed at [facility], [address], [place], [place] [address]   Blood culture (routine x 2)     Status: None   Collection Time: 03/05/21  2:20 PM   Specimen: BLOOD RIGHT ARM  Result Value Ref Range Status   Specimen Description   Final    BLOOD RIGHT ARM Performed at [facility], [address], [place], [place] [address]    Special Requests   Final    BOTTLES DRAWN AEROBIC AND ANAEROBIC Blood Culture adequate volume Performed at [facility], [address], [place], [place] [address]    Culture   Final    NO GROWTH 5 DAYS Performed at [facility], [place] [address] [place], [place] [address]    Report Status 03/10/2021 FINAL  Final  Blood culture (routine x 2)     Status: None   Collection Time: 03/05/21  2:40 PM   Specimen: BLOOD  Result Value Ref Range Status   Specimen Description BLOOD LEFT ANTECUBITAL  Final   Special Requests   Final    BOTTLES DRAWN AEROBIC AND ANAEROBIC Blood Culture adequate volume   Culture   Final    NO GROWTH 5 DAYS Performed at [facility], [place] [address] [place],  [address]    Report Status 03/10/2021 FINAL  Final     Labs: Basic Metabolic Panel: Recent Labs  Lab 03/05/21 1052 03/06/21 0739 03/07/21 0421 03/09/21 0427  NA 136 139 139 135  K 3.6 3.8 3.3* 3.9  CL 102 106 107 100  CO2 25 22 27 25   GLUCOSE 92 109* 103* 135*  BUN 6 7 13 15   CREATININE 0.49 0.50 0.56 0.53  CALCIUM 9.3  8.4* 8.7* 9.0   Liver Function Tests: Recent Labs  Lab 03/05/21 1427 03/06/21 0739 03/07/21 0421  AST 16 13* 15  ALT 7 6 9   ALKPHOS 52 42 38  BILITOT 1.0 0.6 0.8  PROT 8.6* 7.1 6.7  ALBUMIN 4.4 3.7 3.4*   CBC: Recent Labs  Lab 03/05/21 1052 03/06/21 0739 03/07/21 0421 03/09/21 0427  WBC 7.2 7.2 9.4 9.4  NEUTROABS  --  6.6  --   --   HGB 12.8 12.1 11.3* 12.2  HCT  38.6 35.8* 34.4* 36.5  MCV 88.5 88.4 90.3 89.2  PLT 291 310 280 370   CBG: No results for input(s): GLUCAP in the last 168 hours. Hgb A1c No results for input(s): HGBA1C in the last 72 hours. Lipid Profile No results for input(s): CHOL, HDL, LDLCALC, TRIG, CHOLHDL, LDLDIRECT in the last 72 hours. Thyroid function studies No results for input(s): TSH, T4TOTAL, T3FREE, THYROIDAB in the last 72 hours.  Invalid input(s): FREET3 Urinalysis    Component Value Date/Time   COLORURINE YELLOW 03/05/2021 [place] 03/05/2021 1052   LABSPEC 1.009 03/05/2021 1052   PHURINE 5.5 03/05/2021 [place] 03/05/2021 1052   HGBUR TRACE (A) 03/05/2021 [place] 03/05/2021 1052   KETONESUR 15 (A) 03/05/2021 1052   PROTEINUR NEGATIVE 03/05/2021 1052   NITRITE NEGATIVE 03/05/2021 1052   LEUKOCYTESUR NEGATIVE 03/05/2021 1052    FURTHER DISCHARGE INSTRUCTIONS:   Get Medicines reviewed and adjusted: Please take all your medications with you for your next visit with your Primary MD   Laboratory/radiological data: Please request your Primary MD to go over all hospital tests and procedure/radiological results at the follow up, please ask your Primary MD to get all Hospital records sent to his/her office.   In some cases, they will be blood work, cultures and biopsy results pending at the time of your discharge. Please request that your primary care M.D. goes through all the records of your hospital data and follows up on these results.   Also Note the following: If you experience worsening of your admission symptoms, develop shortness of breath, life threatening emergency, suicidal or homicidal thoughts you must seek medical attention immediately by calling 911 or calling your MD immediately  if symptoms less severe.   You must read complete instructions/literature along with all the possible adverse reactions/side effects for all the Medicines you take and  that have been prescribed to you. Take any new Medicines after you have completely understood and accpet all the possible adverse reactions/side effects.    Do not drive when taking Pain medications or sleeping medications (Benzodaizepines)   Do not take more than prescribed Pain, Sleep and Anxiety Medications. It is not advisable to combine anxiety,sleep and pain medications without talking with your primary care practitioner   Special Instructions: If you have smoked or chewed Tobacco  in the last 2 yrs please stop smoking, stop any regular Alcohol  and or any Recreational drug use.   Wear Seat belts while driving.   Please note: You were cared for by a hospitalist during your hospital stay. Once you are discharged, your primary care physician will handle any further medical issues. Please note that NO REFILLS for any discharge medications will be authorized once you are discharged, as it is imperative that you return to your primary care physician (or establish a relationship with a primary care physician if you do not have one) for your post hospital discharge needs  so that they can reassess your need for medications and monitor your lab values.  Time coordinating discharge: 40 minutes  SIGNED:  Marzetta Board, MD, PhD 03/12/2021, 9:12 AM

## 2021-03-13 LAB — EPSTEIN BARR VRS(EBV DNA BY PCR): EBV DNA QN by PCR: NEGATIVE IU/mL

## 2022-08-29 ENCOUNTER — Ambulatory Visit: Admit: 2022-08-29 | Discharge status: Against Medical Advice | Payer: BLUE CROSS/BLUE SHIELD
